# Patient Record
Sex: Female | Born: 1965
Health system: Southern US, Community
[De-identification: ages and names within clinical notes are randomized; demographics above are authoritative.]

## PROBLEM LIST (undated history)

## (undated) DIAGNOSIS — B009 Herpesviral infection, unspecified: Secondary | ICD-10-CM

## (undated) DIAGNOSIS — F4310 Post-traumatic stress disorder, unspecified: Secondary | ICD-10-CM

## (undated) DIAGNOSIS — J45909 Unspecified asthma, uncomplicated: Secondary | ICD-10-CM

## (undated) DIAGNOSIS — D649 Anemia, unspecified: Secondary | ICD-10-CM

## (undated) DIAGNOSIS — T1490XA Injury, unspecified, initial encounter: Secondary | ICD-10-CM

## (undated) DIAGNOSIS — T7840XA Allergy, unspecified, initial encounter: Secondary | ICD-10-CM

## (undated) DIAGNOSIS — M797 Fibromyalgia: Secondary | ICD-10-CM

## (undated) DIAGNOSIS — R87629 Unspecified abnormal cytological findings in specimens from vagina: Secondary | ICD-10-CM

## (undated) DIAGNOSIS — N979 Female infertility, unspecified: Secondary | ICD-10-CM

## (undated) DIAGNOSIS — Z8541 Personal history of malignant neoplasm of cervix uteri: Secondary | ICD-10-CM

## (undated) DIAGNOSIS — B977 Papillomavirus as the cause of diseases classified elsewhere: Secondary | ICD-10-CM

## (undated) HISTORY — PX: OTHER SURGICAL HISTORY: SHX169

## (undated) HISTORY — DX: Fibromyalgia: M79.7

## (undated) HISTORY — DX: Injury, unspecified, initial encounter: T14.90XA

## (undated) HISTORY — DX: Anemia, unspecified: D64.9

## (undated) HISTORY — DX: Unspecified abnormal cytological findings in specimens from vagina: R87.629

## (undated) HISTORY — DX: Allergy, unspecified, initial encounter: T78.40XA

## (undated) HISTORY — DX: Female infertility, unspecified: N97.9

## (undated) HISTORY — DX: Herpesviral infection, unspecified: B00.9

---

## 1987-04-11 HISTORY — PX: CERVIX SURGERY: SHX593

## 1989-04-10 HISTORY — PX: RHINOPLASTY: SUR1284

## 1998-04-10 HISTORY — PX: MANDIBLE RECONSTRUCTION: SHX431

## 1998-06-22 ENCOUNTER — Encounter: Payer: Self-pay | Admitting: Oral & Maxillofacial Surgery

## 1998-06-28 ENCOUNTER — Inpatient Hospital Stay (HOSPITAL_COMMUNITY): Admission: RE | Admit: 1998-06-28 | Discharge: 1998-06-29 | Payer: Self-pay | Admitting: Oral & Maxillofacial Surgery

## 1999-09-07 ENCOUNTER — Inpatient Hospital Stay (HOSPITAL_COMMUNITY): Admission: EM | Admit: 1999-09-07 | Discharge: 1999-09-11 | Payer: Self-pay

## 1999-09-12 ENCOUNTER — Other Ambulatory Visit: Admission: RE | Admit: 1999-09-12 | Discharge: 1999-09-23 | Payer: Self-pay

## 1999-09-20 ENCOUNTER — Emergency Department (HOSPITAL_COMMUNITY): Admission: EM | Admit: 1999-09-20 | Discharge: 1999-09-21 | Payer: Self-pay | Admitting: Emergency Medicine

## 1999-11-25 ENCOUNTER — Encounter: Payer: Self-pay | Admitting: Family Medicine

## 1999-11-25 ENCOUNTER — Encounter: Admission: RE | Admit: 1999-11-25 | Discharge: 1999-11-25 | Payer: Self-pay | Admitting: Family Medicine

## 2000-04-09 ENCOUNTER — Other Ambulatory Visit: Admission: RE | Admit: 2000-04-09 | Discharge: 2000-04-09 | Payer: Self-pay | Admitting: Obstetrics and Gynecology

## 2000-09-11 ENCOUNTER — Encounter: Payer: Self-pay | Admitting: *Deleted

## 2000-09-11 ENCOUNTER — Inpatient Hospital Stay (HOSPITAL_COMMUNITY): Admission: AD | Admit: 2000-09-11 | Discharge: 2000-09-11 | Payer: Self-pay | Admitting: *Deleted

## 2001-04-24 ENCOUNTER — Other Ambulatory Visit: Admission: RE | Admit: 2001-04-24 | Discharge: 2001-04-24 | Payer: Self-pay | Admitting: *Deleted

## 2002-02-15 ENCOUNTER — Encounter: Payer: Self-pay | Admitting: Emergency Medicine

## 2002-02-15 ENCOUNTER — Emergency Department (HOSPITAL_COMMUNITY): Admission: EM | Admit: 2002-02-15 | Discharge: 2002-02-15 | Payer: Self-pay | Admitting: Emergency Medicine

## 2002-05-23 ENCOUNTER — Other Ambulatory Visit: Admission: RE | Admit: 2002-05-23 | Discharge: 2002-05-23 | Payer: Self-pay | Admitting: *Deleted

## 2008-10-05 ENCOUNTER — Other Ambulatory Visit: Admission: RE | Admit: 2008-10-05 | Discharge: 2008-10-05 | Payer: Self-pay | Admitting: Internal Medicine

## 2008-10-07 ENCOUNTER — Encounter: Admission: RE | Admit: 2008-10-07 | Discharge: 2008-11-04 | Payer: Self-pay | Admitting: Internal Medicine

## 2010-12-27 ENCOUNTER — Other Ambulatory Visit (HOSPITAL_COMMUNITY)
Admission: RE | Admit: 2010-12-27 | Discharge: 2010-12-27 | Disposition: A | Discharge status: Home / Self Care | Payer: BC Managed Care – PPO | Source: Ambulatory Visit | Attending: Internal Medicine | Admitting: Internal Medicine

## 2010-12-27 ENCOUNTER — Other Ambulatory Visit: Payer: Self-pay | Admitting: *Deleted

## 2010-12-27 DIAGNOSIS — Z1159 Encounter for screening for other viral diseases: Secondary | ICD-10-CM | POA: Insufficient documentation

## 2010-12-27 DIAGNOSIS — Z01419 Encounter for gynecological examination (general) (routine) without abnormal findings: Secondary | ICD-10-CM | POA: Insufficient documentation

## 2012-04-25 ENCOUNTER — Emergency Department (HOSPITAL_COMMUNITY)
Admission: EM | Admit: 2012-04-25 | Discharge: 2012-04-25 | Disposition: A | Discharge status: Home / Self Care | Payer: BC Managed Care – PPO | Attending: Emergency Medicine | Admitting: Emergency Medicine

## 2012-04-25 ENCOUNTER — Encounter (HOSPITAL_COMMUNITY): Payer: Self-pay | Admitting: *Deleted

## 2012-04-25 ENCOUNTER — Emergency Department (HOSPITAL_COMMUNITY): Payer: BC Managed Care – PPO

## 2012-04-25 DIAGNOSIS — M7530 Calcific tendinitis of unspecified shoulder: Secondary | ICD-10-CM | POA: Insufficient documentation

## 2012-04-25 DIAGNOSIS — Z8659 Personal history of other mental and behavioral disorders: Secondary | ICD-10-CM | POA: Insufficient documentation

## 2012-04-25 DIAGNOSIS — J45909 Unspecified asthma, uncomplicated: Secondary | ICD-10-CM | POA: Insufficient documentation

## 2012-04-25 HISTORY — DX: Unspecified asthma, uncomplicated: J45.909

## 2012-04-25 HISTORY — DX: Post-traumatic stress disorder, unspecified: F43.10

## 2012-04-25 MED ORDER — CYCLOBENZAPRINE HCL 10 MG PO TABS
10.0000 mg | ORAL_TABLET | Freq: Once | ORAL | Status: AC
  Administered 2012-04-25: 10 mg via ORAL
  Filled 2012-04-25: qty 1

## 2012-04-25 MED ORDER — IBUPROFEN 600 MG PO TABS: 600.0000 mg | ORAL_TABLET | Freq: Four times a day (QID) | ORAL | Status: DC | PRN

## 2012-04-25 MED ORDER — CYCLOBENZAPRINE HCL 10 MG PO TABS: 10.0000 mg | ORAL_TABLET | Freq: Two times a day (BID) | ORAL | Status: DC | PRN

## 2012-04-25 MED ORDER — IBUPROFEN 800 MG PO TABS
800.0000 mg | ORAL_TABLET | Freq: Once | ORAL | Status: AC
  Administered 2012-04-25: 800 mg via ORAL
  Filled 2012-04-25: qty 1

## 2012-04-25 NOTE — ED Provider Notes (Signed)
History     CSN: [id number]  Arrival date & time 04/25/12  9147   First MD Initiated Contact with Patient 04/25/12 0454      Chief Complaint  Patient presents with  . Shoulder Pain    (Consider location/radiation/quality/duration/timing/severity/associated sxs/prior treatment) HPI Comments: [name] presents ambulatory for evaluation.  She reports waking yesterday morning with severe right shoulder pain.  She initially attributed the discomfort to sleeping awkwardly, however instead of improving as the day progressed, the pain became more severe.  She cannot recall any specific trauma or awkward movement that may have injured the joint.  She does recount lifting her 50+ pound dog the day before.  She denies fever, joint swelling, acute neck pain, CP, SOB, palpitations, migratory arthropathies, and any hx of being immunocompromised.  Patient is a 47 y.o. female presenting with shoulder pain. The history is provided by the patient. No language interpreter was used.  Shoulder Pain This is a new problem. The current episode started yesterday. The problem occurs constantly. The problem has been gradually worsening. Pertinent negatives include no chest pain, no abdominal pain, no headaches and no shortness of breath. Exacerbated by: using the limb. Relieved by: remaining still. She has tried nothing for the symptoms.    Past Medical History  Diagnosis Date  . Asthma   . PTSD (post-traumatic stress disorder)     Past Surgical History  Procedure Date  . Mandible reconstruction     No family history on file.  History  Substance Use Topics  . Smoking status: Not on file  . Smokeless tobacco: Not on file  . Alcohol Use: No    OB History    Grav Para Term Preterm Abortions TAB SAB Ect Mult Living                  Review of Systems  Respiratory: Negative for shortness of breath.   Cardiovascular: Negative for chest pain.  Gastrointestinal: Negative for abdominal pain.    Neurological: Negative for headaches.  All other systems reviewed and are negative.    Allergies  Sulfa antibiotics  Home Medications  No current outpatient prescriptions on file.  BP 134/83  Pulse 105  Temp 98 F (36.7 C)  Resp 20  SpO2 100%  LMP 03/25/2012  Physical Exam  Nursing note and vitals reviewed. Constitutional: She is oriented to person, place, and time. She appears well-developed and well-nourished. No distress.  HENT:  Head: Normocephalic and atraumatic.  Right Ear: External ear normal.  Left Ear: External ear normal.  Nose: Nose normal.  Mouth/Throat: Oropharynx is clear and moist.  Eyes: Conjunctivae normal are normal. Pupils are equal, round, and reactive to light. Right eye exhibits no discharge. Left eye exhibits no discharge. No scleral icterus.  Neck: Normal range of motion. Neck supple. No JVD present. No tracheal deviation present.  Cardiovascular: Normal rate, regular rhythm and intact distal pulses.  Exam reveals no gallop and no friction rub.   No murmur heard. Pulmonary/Chest: Effort normal and breath sounds normal. No stridor. No respiratory distress. She has no wheezes. She has no rales. She exhibits no tenderness.  Abdominal: Soft. Bowel sounds are normal. There is no tenderness. There is no rebound and no guarding.  Musculoskeletal: She exhibits tenderness. She exhibits no edema.       Right shoulder: She exhibits decreased range of motion, tenderness and pain. She exhibits no bony tenderness, no swelling, no effusion, no crepitus, no laceration and no spasm.  Left shoulder: Normal.       Right elbow: Normal.      Cervical back: Normal.       Note pain with palp diffusely over the anterior shoulder.  No bruising noted.  Note a normal and symmetric contour to the shoulder.  There is no joint fullness or palpable effusion.  Note no widening of the [facility] joint and the humeral head appears appropriately placed.  Pt is holding the arm adducted and  internally rotated with the elbow flexed.  She is able to raise (abduct) the arm to almost 45 degrees forward and laterally before pain limits the further mvmnt.    Neurological: She is alert and oriented to person, place, and time.  Skin: Skin is warm and dry. No rash noted. She is not diaphoretic. No erythema. No pallor.  Psychiatric: She has a normal mood and affect. Her behavior is normal.    ED Course  Procedures (including critical care time)  Labs Reviewed - No data to display No results found.   No diagnosis found.    MDM  Pt presents for evaluation of right shoulder pain.  She appears nontoxic, note stable VS, NAD.  She has anterior shoulder pain without deformity and diminished abduction and external rotation actively and passively (secondary to pain).  Will obtain x-rays of the shoulder and have a sling placed for pt comfort.  1610.  Pt stable, NAD.  There are calcifications lateral to the humeral head that suggests calcific tendonitis as opposed to another process.  Will prescribe a course of ibuprofen and flexeril.  Will refer to Dr. Carola Frost (ortho).       Tobin Chad, MD 04/25/12 [phone]

## 2012-04-25 NOTE — ED Notes (Signed)
Pt states woke up yesterday morning with right shoulder pain; chiropractor worked on shoulder yesterday and made it worse

## 2012-06-02 ENCOUNTER — Encounter (HOSPITAL_COMMUNITY): Payer: Self-pay | Admitting: *Deleted

## 2012-06-02 ENCOUNTER — Inpatient Hospital Stay (HOSPITAL_COMMUNITY): Payer: BC Managed Care – PPO

## 2012-06-02 ENCOUNTER — Inpatient Hospital Stay (HOSPITAL_COMMUNITY)
Admission: AD | Admit: 2012-06-02 | Discharge: 2012-06-02 | Disposition: A | Discharge status: Home / Self Care | Payer: BC Managed Care – PPO | Source: Ambulatory Visit | Attending: Family Medicine | Admitting: Family Medicine

## 2012-06-02 DIAGNOSIS — D251 Intramural leiomyoma of uterus: Secondary | ICD-10-CM | POA: Insufficient documentation

## 2012-06-02 DIAGNOSIS — N949 Unspecified condition associated with female genital organs and menstrual cycle: Secondary | ICD-10-CM

## 2012-06-02 DIAGNOSIS — N938 Other specified abnormal uterine and vaginal bleeding: Secondary | ICD-10-CM

## 2012-06-02 HISTORY — DX: Personal history of malignant neoplasm of cervix uteri: Z85.41

## 2012-06-02 HISTORY — DX: Papillomavirus as the cause of diseases classified elsewhere: B97.7

## 2012-06-02 LAB — COMPREHENSIVE METABOLIC PANEL
ALT: 10 U/L (ref 0–35)
AST: 15 U/L (ref 0–37)
CO2: 26 mEq/L (ref 19–32)
Calcium: 8.8 mg/dL (ref 8.4–10.5)
Chloride: 104 mEq/L (ref 96–112)
Creatinine, Ser: 0.72 mg/dL (ref 0.50–1.10)
GFR calc Af Amer: >90 mL/min (ref >90)
GFR calc non Af Amer: >90 mL/min (ref >90)
Glucose, Bld: 108 mg/dL — ABNORMAL HIGH (ref 70–99)
Sodium: 138 mEq/L (ref 135–145)
Total Bilirubin: 0.2 mg/dL — ABNORMAL LOW (ref 0.3–1.2)

## 2012-06-02 LAB — CBC
Hemoglobin: 12.7 g/dL (ref 12.0–15.0)
MCH: 30 pg (ref 26.0–34.0)
MCV: 90.3 fL (ref 78.0–100.0)
RBC: 4.24 MIL/uL (ref 3.87–5.11)
WBC: 9.5 10*3/uL (ref 4.0–10.5)

## 2012-06-02 MED ORDER — LACTATED RINGERS IV BOLUS (SEPSIS): 1000.0000 mL | Freq: Once | INTRAVENOUS | Status: DC

## 2012-06-02 MED ORDER — OXYCODONE-ACETAMINOPHEN 5-325 MG PO TABS: 1.0000 | ORAL_TABLET | ORAL | Status: DC | PRN

## 2012-06-02 MED ORDER — OXYCODONE-ACETAMINOPHEN 5-325 MG PO TABS: 1.0000 | ORAL_TABLET | Freq: Four times a day (QID) | ORAL | Status: DC | PRN

## 2012-06-02 MED ORDER — OXYCODONE-ACETAMINOPHEN 5-325 MG PO TABS
1.0000 | ORAL_TABLET | Freq: Once | ORAL | Status: DC
  Filled 2012-06-02: qty 1

## 2012-06-02 MED ORDER — ACETAMINOPHEN-CODEINE #3 300-30 MG PO TABS: 1.0000 | ORAL_TABLET | ORAL | Status: DC | PRN

## 2012-06-02 MED ORDER — CYCLOBENZAPRINE HCL 10 MG PO TABS
10.0000 mg | ORAL_TABLET | Freq: Once | ORAL | Status: AC
  Administered 2012-06-02: 10 mg via ORAL
  Filled 2012-06-02: qty 1

## 2012-06-02 NOTE — MAU Provider Note (Signed)
Care assumed from [facility], [place] at 0800, pt. In [place].   Clinical Data: Pelvic pain/bleeding. History of cervical cancer  and human papilloma virus infection.  TRANSABDOMINAL AND TRANSVAGINAL ULTRASOUND OF PELVIS  Technique: Both transabdominal and transvaginal ultrasound  examinations of the pelvis were performed. Transabdominal technique  was performed for global imaging of the pelvis including uterus,  ovaries, adnexal regions, and pelvic cul-de-sac.  It was necessary to proceed with endovaginal exam following the  transabdominal exam to visualize the uterus, ovaries, and adnexa .  Comparison: None  Findings:  Uterus: 11.3 x 6.7 x 8.3 cm. Heterogeneous echotexture throughout.  Numerous uterine masses. A posterior uterine body myometrial  lesion measures 4.4 x 4.1 x 4.4 cm and is heterogeneous in  shadowing.  An anterior intramural body/lower uterine segment lesion measures  3.6 x 1.5 x 2.4 cm.  An anterior right-sided uterine body intramural lesion measures 3.9  x 2.9 x 3.5 cm.  Endometrium: Poorly evaluated secondary to the extent of uterine  masses. Grossly normal at 8 mm on image 51.  Right ovary: 2.6 x 1.6 x 1.4 cm. Normal in morphology.  Left ovary: 2.6 x 1.7 x 1.7 cm. Suspect a corpus luteal cyst  within, including on image 58.  Other findings: No free fluid  IMPRESSION:  1. Multiple uterine masses, most consistent with fibroids.  2. The extent of uterine distortion secondary to fibroids makes  evaluation of the endometrium difficult. Grossly within normal  limits. If symptoms warrant, non emergent MRI may be informative.  3. Probable left ovarian corpus luteal cyst.  Original Report Authenticated By: Jeronimo Greaves, M.D.  Results for orders placed during the hospital encounter of 06/02/12 (from the past 24 hour(s))  CBC     Status: None   Collection Time    06/02/12  6:27 AM      Result Value Range   WBC 9.5  4.0 - 10.5 K/uL   RBC 4.24  3.87 - 5.11 MIL/uL   Hemoglobin 12.7  12.0 - 15.0 g/dL   HCT 45.4  09.8 - 11.9 %   MCV 90.3  78.0 - 100.0 fL   MCH 30.0  26.0 - 34.0 pg   MCHC 33.2  30.0 - 36.0 g/dL   RDW 14.7  82.9 - 56.2 %   Platelets 233  150 - 400 K/uL  COMPREHENSIVE METABOLIC PANEL     Status: Abnormal   Collection Time    06/02/12  6:27 AM      Result Value Range   Sodium 138  135 - 145 mEq/L   Potassium 4.3  3.5 - 5.1 mEq/L   Chloride 104  96 - 112 mEq/L   CO2 26  19 - 32 mEq/L   Glucose, Bld 108 (*) 70 - 99 mg/dL   BUN 10  6 - 23 mg/dL   Creatinine, Ser 1.30  0.50 - 1.10 mg/dL   Calcium 8.8  8.4 - 86.5 mg/dL   Total Protein 6.4  6.0 - 8.3 g/dL   Albumin 3.6  3.5 - 5.2 g/dL   AST 15  0 - 37 U/L   ALT 10  0 - 35 U/L   Alkaline Phosphatase 53  39 - 117 U/L   Total Bilirubin 0.2 (*) 0.3 - 1.2 mg/dL   GFR calc non Af Amer >90  >90 mL/min   GFR calc Af Amer >90  >90 mL/min    Report reviewed with pt. Questions answered and explained fibroids and reassured re: small CLC.  Pt tearful and anxious, financial problems and no care provider.  Percocet 2 tabs po given.   Assessment 1. Fibroids, intramural   Pelvic pain and AUB  Plan Discharge home. See AVS for pt ed.    Medication List    TAKE these medications       acetaminophen-codeine 300-30 MG per tablet  Commonly known as:  TYLENOL #3  Take 1 tablet by mouth every 4 (four) hours as needed for pain.     albuterol 108 (90 BASE) MCG/ACT inhaler  Commonly known as:  PROVENTIL HFA;VENTOLIN HFA  Inhale 2 puffs into the lungs every 6 (six) hours as needed. For shortness of breath     cyclobenzaprine 10 MG tablet  Commonly known as:  FLEXERIL  Take 1 tablet (10 mg total) by mouth 2 (two) times daily as needed for muscle spasms.     EPINEPHrine 0.3 mg/0.3 mL Devi  Commonly known as:  EPI-PEN  Inject 0.3 mg into the muscle as needed. For allergic reactions     ibuprofen 600 MG tablet  Commonly known as:  ADVIL,MOTRIN  Take 1 tablet (600 mg total) by mouth every 6 (six)  hours as needed for pain.       Follow-up Information   Schedule an appointment as soon as possible for a visit with [facility]. (Someone form [facility] will call you with an appointment)    Contact information:   [address] [place] [address] [phone]

## 2012-06-02 NOTE — MAU Provider Note (Signed)
  History     CSN: [id number]  Arrival date and time: 06/02/12 0442   None     Chief Complaint  Patient presents with  . Menorrhagia   HPI Pt is here with report of abdominal pain that started Friday.  Reports taking pamprin with some relief.  Also reports vaginal bleeding that started on Friday.  Bleeding heavy now. Reports not having on a pad at this.  Using a tampon and paper towel.  Reports history of this happening before.  Patient's last menstrual period was 05/17/2012.  Does not report a cycle in January or December.  No current birth control.     Past Medical History  Diagnosis Date  . Asthma   . PTSD (post-traumatic stress disorder)   . Hx of cervical cancer   . HPV (human papilloma virus) infection     Past Surgical History  Procedure Laterality Date  . Mandible reconstruction      History reviewed. No pertinent family history.  History  Substance Use Topics  . Smoking status: Not on file  . Smokeless tobacco: Not on file  . Alcohol Use: No    Allergies:  Allergies  Allergen Reactions  . Food     Multiple foods trigger patients asthma ,some lead to anaphylactic shock   . Sulfa Antibiotics Rash    Prescriptions prior to admission  Medication Sig Dispense Refill  . ibuprofen (ADVIL,MOTRIN) 600 MG tablet Take 1 tablet (600 mg total) by mouth every 6 (six) hours as needed for pain.  28 tablet  0  . albuterol (PROVENTIL HFA;VENTOLIN HFA) 108 (90 BASE) MCG/ACT inhaler Inhale 2 puffs into the lungs every 6 (six) hours as needed. For shortness of breath      . cyclobenzaprine (FLEXERIL) 10 MG tablet Take 1 tablet (10 mg total) by mouth 2 (two) times daily as needed for muscle spasms.  20 tablet  0  . EPINEPHrine (EPI-PEN) 0.3 mg/0.3 mL DEVI Inject 0.3 mg into the muscle as needed. For allergic reactions        Review of Systems  Constitutional: Negative for malaise/fatigue.  Respiratory: Negative for shortness of breath.   Gastrointestinal: Positive for  abdominal pain (cramping).  Genitourinary:       Vaginal bleeding  Neurological: Negative for dizziness and headaches.   Physical Exam   Blood pressure 118/70, pulse 79, temperature 98.4 F (36.9 C), temperature source Oral, resp. rate 20, height 5\' 6"  (1.676 m), weight 77.168 kg (170 lb 2 oz), last menstrual period 05/17/2012.  Physical Exam  Constitutional: She is oriented to person, place, and time. She appears well-developed and well-nourished. No distress.  HENT:  Head: Normocephalic.  Neck: Normal range of motion. Neck supple.  Cardiovascular: Normal rate, regular rhythm and normal heart sounds.   Respiratory: Effort normal and breath sounds normal.  GI: Soft. There is no tenderness.  Genitourinary: There is bleeding (negative clots, moderate) around the vagina.  Musculoskeletal: Normal range of motion. She exhibits no edema.  Neurological: She is alert and oriented to person, place, and time. She has normal reflexes.  Skin: Skin is warm and dry.    MAU Course  Procedures  0800 Deirdre Poe assumes care of patient.   Assessment and Plan    [facility] 06/02/2012, 5:46 AM

## 2012-06-02 NOTE — MAU Note (Signed)
PT SAYS SHE HAS ABD  PAIN  - STARTED  Friday- TOOK TODAY 1 PAMPRIN.- SOME RELIEF.  VAG BLEEDING STARTED Friday NIGHT - HEAVY . NO PAD ON NOW- BUT  TAMPON AND PAPER TOWELS.    THIS IS NOT FIRST  TIME HAPPENED.   LMP WAS 2-7,  THINKS NO CYCLE IN JAN AND DEC.  NO BIRTH CONTROL .

## 2012-06-02 NOTE — ED Notes (Signed)
Pt refused to take percocet stated she wanted tylenol #3 . Offered to have order changed pt did not want to wait. Notified D.Poe, CNM. Discharge instructions given pt went home with RX for tylenol #3.

## 2012-06-03 NOTE — MAU Provider Note (Signed)
Attestation of Attending Supervision of Advanced Practitioner (CNM/NP): Evaluation and management procedures were performed by the Advanced Practitioner under my supervision and collaboration.  I have reviewed the Advanced Practitioner's note and chart, and I agree with the management and plan.  Tyronn Golda 06/03/2012 8:46 AM

## 2012-06-04 ENCOUNTER — Encounter: Payer: Self-pay | Admitting: Obstetrics & Gynecology

## 2012-06-05 NOTE — MAU Provider Note (Signed)
Chart reviewed and agree with management and plan.  

## 2012-07-03 ENCOUNTER — Encounter: Payer: BC Managed Care – PPO | Admitting: Obstetrics & Gynecology

## 2012-07-26 ENCOUNTER — Encounter: Payer: BC Managed Care – PPO | Admitting: Obstetrics & Gynecology

## 2012-08-05 ENCOUNTER — Encounter: Payer: BC Managed Care – PPO | Admitting: Obstetrics & Gynecology

## 2013-05-13 ENCOUNTER — Inpatient Hospital Stay (HOSPITAL_COMMUNITY)
Admission: AD | Admit: 2013-05-13 | Discharge: 2013-05-14 | Disposition: A | Discharge status: Home / Self Care | Payer: Self-pay | Source: Ambulatory Visit | Attending: Obstetrics & Gynecology | Admitting: Obstetrics & Gynecology

## 2013-05-13 ENCOUNTER — Encounter (HOSPITAL_COMMUNITY): Payer: Self-pay | Admitting: *Deleted

## 2013-05-13 ENCOUNTER — Inpatient Hospital Stay (HOSPITAL_COMMUNITY): Payer: Self-pay

## 2013-05-13 DIAGNOSIS — R109 Unspecified abdominal pain: Secondary | ICD-10-CM | POA: Insufficient documentation

## 2013-05-13 DIAGNOSIS — N949 Unspecified condition associated with female genital organs and menstrual cycle: Secondary | ICD-10-CM | POA: Insufficient documentation

## 2013-05-13 DIAGNOSIS — D259 Leiomyoma of uterus, unspecified: Secondary | ICD-10-CM | POA: Insufficient documentation

## 2013-05-13 DIAGNOSIS — N938 Other specified abnormal uterine and vaginal bleeding: Secondary | ICD-10-CM | POA: Insufficient documentation

## 2013-05-13 DIAGNOSIS — N926 Irregular menstruation, unspecified: Secondary | ICD-10-CM

## 2013-05-13 DIAGNOSIS — N939 Abnormal uterine and vaginal bleeding, unspecified: Secondary | ICD-10-CM

## 2013-05-13 DIAGNOSIS — N83209 Unspecified ovarian cyst, unspecified side: Secondary | ICD-10-CM | POA: Insufficient documentation

## 2013-05-13 DIAGNOSIS — D219 Benign neoplasm of connective and other soft tissue, unspecified: Secondary | ICD-10-CM

## 2013-05-13 LAB — CBC
HEMATOCRIT: 35.1 % — AB (ref 36.0–46.0)
Hemoglobin: 11.7 g/dL — ABNORMAL LOW (ref 12.0–15.0)
MCH: 29.8 pg (ref 26.0–34.0)
MCHC: 33.3 g/dL (ref 30.0–36.0)
MCV: 89.5 fL (ref 78.0–100.0)
Platelets: 261 10*3/uL (ref 150–400)
RBC: 3.92 MIL/uL (ref 3.87–5.11)
RDW: 12.5 % (ref 11.5–15.5)
WBC: 7.6 10*3/uL (ref 4.0–10.5)

## 2013-05-13 MED ORDER — CYCLOBENZAPRINE HCL 10 MG PO TABS
10.0000 mg | ORAL_TABLET | Freq: Once | ORAL | Status: AC
  Administered 2013-05-14: 10 mg via ORAL
  Filled 2013-05-13: qty 1

## 2013-05-13 MED ORDER — OXYCODONE-ACETAMINOPHEN 5-325 MG PO TABS
1.0000 | ORAL_TABLET | Freq: Once | ORAL | Status: AC
  Administered 2013-05-14: 1 via ORAL
  Filled 2013-05-13: qty 1

## 2013-05-13 MED ORDER — ACETAMINOPHEN-CODEINE #3 300-30 MG PO TABS
1.0000 | ORAL_TABLET | Freq: Once | ORAL | Status: AC
  Administered 2013-05-13: 1 via ORAL
  Filled 2013-05-13: qty 1

## 2013-05-13 NOTE — MAU Note (Signed)
Pt states she was seen here before for fibroid tumors. Pt states bleeding through wash clothes and dish towels

## 2013-05-13 NOTE — MAU Provider Note (Signed)
History     CSN: [id number]  Arrival date and time: 05/13/13 2031   First Provider Initiated Contact with Patient 05/13/13 2059      Chief Complaint  Patient presents with  . Vaginal Bleeding   HPI [name] is a 48 y.o. G1P0010 who presents to MAU today with complaint of heavy vaginal bleeding and lower abdominal pain. The patient states a long history of abnormal uterine bleeding. Patient was seen in MAU 05/2012 and US showed multiple uterine fibroids. Visits in [place] show that patient cancelled and no-showed for follow-up in the clinic. The patient states that she tried numerous times to get a follow-up appointment and couldn't. Today she states 2 days of bleeding. She is soaking through pads and using washcloths or clothing instead of pads. She endorses suprapubic pain that she rates at 8/10 now. She originally denied using any pain medication, but later states that she last took ibuprofen this afternoon. She also states that she feels weak, dizzy and tired.   OB History   Grav Para Term Preterm Abortions TAB SAB Ect Mult Living   1    1 1           Past Medical History  Diagnosis Date  . Asthma   . PTSD (post-traumatic stress disorder)   . Hx of cervical cancer   . HPV (human papilloma virus) infection     Past Surgical History  Procedure Laterality Date  . Mandible reconstruction      History reviewed. No pertinent family history.  History  Substance Use Topics  . Smoking status: Not on file  . Smokeless tobacco: Not on file  . Alcohol Use: No    Allergies:  Allergies  Allergen Reactions  . Food Anaphylaxis    Multiple foods trigger patients asthma ,some lead to anaphylactic shock   . Sulfa Antibiotics Rash    No prescriptions prior to admission    Review of Systems  Constitutional: Positive for malaise/fatigue. Negative for fever.  Gastrointestinal: Positive for abdominal pain. Negative for nausea and vomiting.  Genitourinary:       + vaginal  bleeding  Neurological: Positive for dizziness and weakness. Negative for loss of consciousness.   Physical Exam   Blood pressure 133/91, pulse 80, temperature 98.2 F (36.8 C), temperature source Oral, resp. rate 16, last menstrual period 03/12/2013.  Physical Exam  Constitutional: She is oriented to person, place, and time. She appears well-developed and well-nourished. No distress.  HENT:  Head: Normocephalic and atraumatic.  Cardiovascular: Normal rate.   Respiratory: Effort normal.  GI: Soft. She exhibits no distension and no mass. There is tenderness (lower abdominal tenderness to palpation). There is no rebound and no guarding.  Genitourinary: Uterus is enlarged (slightly, exam limited by patient's body habitus) and tender (mild). Cervix exhibits no motion tenderness, no discharge and no friability. Right adnexum displays no mass and no tenderness. Left adnexum displays no mass and no tenderness. There is bleeding (small amount of blood noted in the vagina) around the vagina. No vaginal discharge found.  Neurological: She is alert and oriented to person, place, and time.  Skin: Skin is warm and dry. No erythema.  Psychiatric: Her mood appears anxious. Her affect is blunt. Her speech is rapid and/or pressured.   Results for orders placed during the hospital encounter of 05/13/13 (from the past 24 hour(s))  CBC     Status: Abnormal   Collection Time    05/13/13 10:30 PM  Result Value Range   WBC 7.6  4.0 - 10.5 K/uL   RBC 3.92  3.87 - 5.11 MIL/uL   Hemoglobin 11.7 (*) 12.0 - 15.0 g/dL   HCT 35.1 (*) 36.0 - 46.0 %   MCV 89.5  78.0 - 100.0 fL   MCH 29.8  26.0 - 34.0 pg   MCHC 33.3  30.0 - 36.0 g/dL   RDW 12.5  11.5 - 15.5 %   Platelets 261  150 - 400 K/uL   US Transvaginal Non-ob  05/13/2013   CLINICAL DATA:  Vaginal bleeding.  History of fibroids  EXAM: TRANSABDOMINAL AND TRANSVAGINAL ULTRASOUND OF PELVIS  TECHNIQUE: Both transabdominal and transvaginal ultrasound  examinations of the pelvis were performed. Transabdominal technique was performed for global imaging of the pelvis including uterus, ovaries, adnexal regions, and pelvic cul-de-sac. It was necessary to proceed with endovaginal exam following the transabdominal exam to visualize the endometrium and adnexae.  COMPARISON:  06/02/2012  FINDINGS: Uterus  Measurements: 12 x 8 x 11 cm. There are at least 4 myometrial masses. The largest is intramural to a sub serosal in the left uterine body, 5 x 3 x 3 cm. The second largest is in the right cornual region, also involving the serosal surface, 3 x 3 x 3 cm. There is a 2 cm mass in the uterine fundus. A 3 x 1 x 2 cm mass in the upper left uterine body is intramural. These masses have heterogeneous echotexture consistent with fibroids.  Endometrium  Distortion by uterine fibroids, limiting assessment of the endometrial thickness. Sensitivity is decreased for detecting of focal endometrial abnormality.  Right ovary  Measurements: The ovarian parenchyma is not enlarged. There is a simple cyst, likely follicular, within the ovary measuring 3.5 x 2.6 x 2.4 cm.  Left ovary  Not seen. Exam was terminated prematurely due to patient discomfort.  Other findings  No free fluid.  IMPRESSION: 1. Fibroid uterus, as above. 2. Left ovary not seen. 3. 3.5 cm simple cyst in the right ovary, likely follicular.   Electronically Signed   By: Jorje Guild M.D.   On: 05/13/2013 23:18   US Pelvis Complete  05/13/2013   CLINICAL DATA:  Vaginal bleeding.  History of fibroids  EXAM: TRANSABDOMINAL AND TRANSVAGINAL ULTRASOUND OF PELVIS  TECHNIQUE: Both transabdominal and transvaginal ultrasound examinations of the pelvis were performed. Transabdominal technique was performed for global imaging of the pelvis including uterus, ovaries, adnexal regions, and pelvic cul-de-sac. It was necessary to proceed with endovaginal exam following the transabdominal exam to visualize the endometrium and adnexae.   COMPARISON:  06/02/2012  FINDINGS: Uterus  Measurements: 12 x 8 x 11 cm. There are at least 4 myometrial masses. The largest is intramural to a sub serosal in the left uterine body, 5 x 3 x 3 cm. The second largest is in the right cornual region, also involving the serosal surface, 3 x 3 x 3 cm. There is a 2 cm mass in the uterine fundus. A 3 x 1 x 2 cm mass in the upper left uterine body is intramural. These masses have heterogeneous echotexture consistent with fibroids.  Endometrium  Distortion by uterine fibroids, limiting assessment of the endometrial thickness. Sensitivity is decreased for detecting of focal endometrial abnormality.  Right ovary  Measurements: The ovarian parenchyma is not enlarged. There is a simple cyst, likely follicular, within the ovary measuring 3.5 x 2.6 x 2.4 cm.  Left ovary  Not seen. Exam was terminated prematurely due to patient discomfort.  Other findings  No free fluid.  IMPRESSION: 1. Fibroid uterus, as above. 2. Left ovary not seen. 3. 3.5 cm simple cyst in the right ovary, likely follicular.   Electronically Signed   By: Jorje Guild M.D.   On: 05/13/2013 23:18    MAU Course  Procedures None  MDM CBC and [place] today Patient is hemodynamically stable Due to patient's psych issues and extensive amount of time was spent on patient education Patient refused Rx for Megace today. States that she "doesn't want to take hormones."  Discussed options for management that would be addressed at the follow-up visit at the patient's request.  She is not interested in Depo provera, Mirena IUD or hysterectomy. She was very interested in ablation.  Patient insisted that Flexeril was helpful for her pain previously. She states that her lower back felt strained.  Assessment and Plan  A: Multiple uterine fibroids Simple ovarian cyst  P: Discharge home Rx for Flexeril and Percocet given to patient Bleeding precautions discussed Patient scheduled to follow-up in [facility] with  MD on 05/15/13 @ 1:00 PM Patient may return to MAU as needed or if her condition were to change or worsen  Farris Has, PA-C  05/14/2013, 4:07 AM

## 2013-05-14 ENCOUNTER — Encounter: Payer: Self-pay | Admitting: Obstetrics & Gynecology

## 2013-05-14 MED ORDER — CYCLOBENZAPRINE HCL 10 MG PO TABS: 10.0000 mg | ORAL_TABLET | Freq: Once | ORAL | Status: DC

## 2013-05-14 MED ORDER — OXYCODONE-ACETAMINOPHEN 5-325 MG PO TABS: 1.0000 | ORAL_TABLET | ORAL | Status: DC | PRN

## 2013-05-14 NOTE — Progress Notes (Signed)
Pt has had abnormal bleeding for 1 year- seen most recently in MAU yesterday.  Tried to discuss the possibility of Dr. Roselie Awkward doing endometrial biopsy today. During my explanation, pt interrupted many times and was resistant to anything I was trying to tell her. Pt does not want to have the test today because she is by herself.

## 2013-05-14 NOTE — Discharge Instructions (Signed)
Abnormal Uterine Bleeding Abnormal uterine bleeding can affect women at various stages in life, including teenagers, women in their reproductive years, pregnant women, and women who have reached menopause. Several kinds of uterine bleeding are considered abnormal, including:  Bleeding or spotting between periods.   Bleeding after sexual intercourse.   Bleeding that is heavier or more than normal.   Periods that last longer than usual.  Bleeding after menopause.  Many cases of abnormal uterine bleeding are minor and simple to treat, while others are more serious. Any type of abnormal bleeding should be evaluated by your health care provider. Treatment will depend on the cause of the bleeding. HOME CARE INSTRUCTIONS Monitor your condition for any changes. The following actions may help to alleviate any discomfort you are experiencing:  Avoid the use of tampons and douches as directed by your health care provider.  Change your pads frequently. You should get regular pelvic exams and Pap tests. Keep all follow-up appointments for diagnostic tests as directed by your health care provider.  SEEK MEDICAL CARE IF:   Your bleeding lasts more than 1 week.   You feel dizzy at times.  SEEK IMMEDIATE MEDICAL CARE IF:   You pass out.   You are changing pads every 15 to 30 minutes.   You have abdominal pain.  You have a fever.   You become sweaty or weak.   You are passing large blood clots from the vagina.   You start to feel nauseous and vomit. MAKE SURE YOU:   Understand these instructions.  Will watch your condition.  Will get help right away if you are not doing well or get worse. Document Released: 03/27/2005 Document Revised: 11/27/2012 Document Reviewed: 10/24/2012 [facility] Patient Information 2014 [place], [place].  Endometrial Ablation Endometrial ablation removes the lining of the uterus (endometrium). It is usually a same-day, outpatient treatment. Ablation  helps avoid major surgery, such as surgery to remove the cervix and uterus (hysterectomy). After endometrial ablation, you will have little or no menstrual bleeding and may not be able to have children. However, if you are premenopausal, you will need to use a reliable method of birth control following the procedure because of the small chance that pregnancy can occur. There are different reasons to have this procedure, which include:  Heavy periods.  Bleeding that is causing anemia.  Irregular bleeding.  Bleeding fibroids on the lining inside the uterus if they are smaller than 3 centimeters. This procedure should not be done if:  You want children in the future.  You have severe cramps with your menstrual period.  You have precancerous or cancerous cells in your uterus.  You were recently pregnant.  You have gone through menopause.  You have had major surgery on the uterus, such as a cesarean delivery. LET [facility] CARE PROVIDER KNOW ABOUT:  Any allergies you have.  All medicines you are taking, including vitamins, herbs, eye drops, creams, and over-the-counter medicines.  Previous problems you or members of your family have had with the use of anesthetics.  Any blood disorders you have.  Previous surgeries you have had.  Medical conditions you have. RISKS AND COMPLICATIONS  Generally, this is a safe procedure. However, as with any procedure, complications can occur. Possible complications include:  Perforation of the uterus.  Bleeding.  Infection of the uterus, bladder, or vagina.  Injury to surrounding organs.  An air bubble to the lung (air embolus).  Pregnancy following the procedure.  Failure of the procedure to help  the problem, requiring hysterectomy.  Decreased ability to diagnose cancer in the lining of the uterus. BEFORE THE PROCEDURE  The lining of the uterus must be tested to make sure there is no pre-cancerous or cancer cells present.  An  ultrasound may be performed to look at the size of the uterus and to check for abnormalities.  Medicines may be given to thin the lining of the uterus. PROCEDURE  During the procedure, your health care provider will use a tool called a resectoscope to help see inside your uterus. There are different ways to remove the lining of your uterus.   Radiofrequency  This method uses a radiofrequency-alternating electric current to remove the lining of the uterus.  Cryotherapy This method uses extreme cold to freeze the lining of the uterus.  Heated-Free Liquid  This method uses heated salt (saline) solution to remove the lining of the uterus.  Microwave This method uses high-energy microwaves to heat up the lining of the uterus to remove it.  Thermal balloon  This method involves inserting a catheter with a balloon tip into the uterus. The balloon tip is filled with heated fluid to remove the lining of the uterus. AFTER THE PROCEDURE  After your procedure, do not have sexual intercourse or insert anything into your vagina until permitted by your health care provider. After the procedure, you may experience:  Cramps.  Vaginal discharge.  Frequent urination. Document Released: 02/04/2004 Document Revised: 11/27/2012 Document Reviewed: 08/28/2012 [facility] Patient Information 2014 [place].

## 2013-05-14 NOTE — MAU Provider Note (Signed)
Attestation of Attending Supervision of Advanced Practitioner (PA/CNM/NP): Evaluation and management procedures were performed by the Advanced Practitioner under my supervision and collaboration.  I have reviewed the Advanced Practitioner's note and chart, and I agree with the management and plan.  Chairty Toman, MD, FACOG Attending Obstetrician & Gynecologist Faculty Practice, [facility]  

## 2013-05-16 ENCOUNTER — Encounter: Payer: BC Managed Care – PPO | Admitting: Obstetrics & Gynecology

## 2013-05-17 NOTE — Progress Notes (Signed)
This encounter was created in error - please disregard.

## 2013-05-21 ENCOUNTER — Inpatient Hospital Stay (HOSPITAL_COMMUNITY)
Admission: AD | Admit: 2013-05-21 | Discharge: 2013-05-21 | Disposition: A | Discharge status: Home / Self Care | Payer: Self-pay | Source: Ambulatory Visit | Attending: Obstetrics & Gynecology | Admitting: Obstetrics & Gynecology

## 2013-05-21 ENCOUNTER — Encounter: Payer: Self-pay | Admitting: Obstetrics and Gynecology

## 2013-05-21 ENCOUNTER — Encounter (HOSPITAL_COMMUNITY): Payer: Self-pay

## 2013-05-21 DIAGNOSIS — T148XXA Other injury of unspecified body region, initial encounter: Secondary | ICD-10-CM

## 2013-05-21 DIAGNOSIS — M549 Dorsalgia, unspecified: Secondary | ICD-10-CM | POA: Insufficient documentation

## 2013-05-21 DIAGNOSIS — J45909 Unspecified asthma, uncomplicated: Secondary | ICD-10-CM | POA: Insufficient documentation

## 2013-05-21 DIAGNOSIS — M545 Low back pain, unspecified: Secondary | ICD-10-CM

## 2013-05-21 DIAGNOSIS — O9989 Other specified diseases and conditions complicating pregnancy, childbirth and the puerperium: Secondary | ICD-10-CM

## 2013-05-21 DIAGNOSIS — O99891 Other specified diseases and conditions complicating pregnancy: Secondary | ICD-10-CM

## 2013-05-21 DIAGNOSIS — F17200 Nicotine dependence, unspecified, uncomplicated: Secondary | ICD-10-CM | POA: Insufficient documentation

## 2013-05-21 LAB — URINALYSIS, ROUTINE W REFLEX MICROSCOPIC
BILIRUBIN URINE: NEGATIVE
Glucose, UA: NEGATIVE mg/dL
HGB URINE DIPSTICK: NEGATIVE
Ketones, ur: NEGATIVE mg/dL
Leukocytes, UA: NEGATIVE
Nitrite: NEGATIVE
PH: 6 (ref 5.0–8.0)
Protein, ur: NEGATIVE mg/dL
Urobilinogen, UA: 0.2 mg/dL (ref 0.0–1.0)

## 2013-05-21 LAB — POCT PREGNANCY, URINE: Preg Test, Ur: NEGATIVE

## 2013-05-21 MED ORDER — KETOROLAC TROMETHAMINE 60 MG/2ML IM SOLN
60.0000 mg | Freq: Once | INTRAMUSCULAR | Status: AC
  Administered 2013-05-21: 60 mg via INTRAMUSCULAR
  Filled 2013-05-21: qty 2

## 2013-05-21 NOTE — Discharge Instructions (Signed)
Back Exercises Back exercises help treat and prevent back injuries. The goal of back exercises is to increase the strength of your abdominal and back muscles and the flexibility of your back. These exercises should be started when you no longer have back pain. Back exercises include:  Pelvic Tilt. Lie on your back with your knees bent. Tilt your pelvis until the lower part of your back is against the floor. Hold this position 5 to 10 sec and repeat 5 to 10 times.  Knee to Chest. Pull first 1 knee up against your chest and hold for 20 to 30 seconds, repeat this with the other knee, and then both knees. This may be done with the other leg straight or bent, whichever feels better.  Sit-Ups or Curl-Ups. Bend your knees 90 degrees. Start with tilting your pelvis, and do a partial, slow sit-up, lifting your trunk only 30 to 45 degrees off the floor. Take at least 2 to 3 seconds for each sit-up. Do not do sit-ups with your knees out straight. If partial sit-ups are difficult, simply do the above but with only tightening your abdominal muscles and holding it as directed.  Hip-Lift. Lie on your back with your knees flexed 90 degrees. Push down with your feet and shoulders as you raise your hips a couple inches off the floor; hold for 10 seconds, repeat 5 to 10 times.  Back arches. Lie on your stomach, propping yourself up on bent elbows. Slowly press on your hands, causing an arch in your low back. Repeat 3 to 5 times. Any initial stiffness and discomfort should lessen with repetition over time.  Shoulder-Lifts. Lie face down with arms beside your body. Keep hips and torso pressed to floor as you slowly lift your head and shoulders off the floor. Do not overdo your exercises, especially in the beginning. Exercises may cause you some mild back discomfort which lasts for a few minutes; however, if the pain is more severe, or lasts for more than 15 minutes, do not continue exercises until you see your caregiver.  Improvement with exercise therapy for back problems is slow.  See your caregivers for assistance with developing a proper back exercise program. Document Released: 05/04/2004 Document Revised: 06/19/2011 Document Reviewed: 01/26/2011 [employer] Patient Information 2014 [employer].  

## 2013-05-21 NOTE — MAU Note (Signed)
Pt reports "major severe tension" all over my body for a couple of days. Took Flexeril at 0200, states she also believes this is due to an allergic reaction to something she ate last pm.

## 2013-05-21 NOTE — MAU Note (Signed)
Required assistance to her car; pt is very needy;

## 2013-05-21 NOTE — MAU Provider Note (Signed)
History     CSN: [id number]  Arrival date and time: 05/21/13 0608   First Provider Initiated Contact with Patient 05/21/13 [phone]      Chief Complaint  Patient presents with  . Back Pain   Back Pain    Brandy Stone is a 48 y.o. G1P0010 who presents today with backache. She was seen on 05/13/13 for heavy vaginal bleeding. She has an appointment today at 2:00 with the clinic. She states that the bleeding has stopped, but now her back hurts. She states that she took flexeril and percocet, but it has not helped. She denies any fever or urinary symptoms. She states the pain starts at the middle of her back and goes down to her sacrum.   Past Medical History  Diagnosis Date  . Asthma   . PTSD (post-traumatic stress disorder)   . Hx of cervical cancer   . HPV (human papilloma virus) infection     Past Surgical History  Procedure Laterality Date  . Mandible reconstruction      Family History  Problem Relation Age of Onset  . Endometriosis Mother   . Hypertension Mother   . Hyperlipidemia Mother   . Cancer Father     lung  . Hyperlipidemia Father     History  Substance Use Topics  . Smoking status: Current Some Day Smoker    Types: Cigarettes  . Smokeless tobacco: Not on file  . Alcohol Use: Yes     Comment: social    Allergies:  Allergies  Allergen Reactions  . Food Anaphylaxis    Multiple foods trigger patients asthma ,some lead to anaphylactic shock   . Sulfa Antibiotics Rash    Prescriptions prior to admission  Medication Sig Dispense Refill  . albuterol (PROVENTIL HFA;VENTOLIN HFA) 108 (90 BASE) MCG/ACT inhaler Inhale 2 puffs into the lungs every 6 (six) hours as needed. For shortness of breath      . cyclobenzaprine (FLEXERIL) 10 MG tablet Take 1 tablet (10 mg total) by mouth once.  30 tablet  0  . ibuprofen (ADVIL,MOTRIN) 600 MG tablet Take 1 tablet (600 mg total) by mouth every 6 (six) hours as needed for pain.  28 tablet  0  . oxyCODONE-acetaminophen  (PERCOCET/ROXICET) 5-325 MG per tablet Take 1-2 tablets by mouth every 4 (four) hours as needed for severe pain.  15 tablet  0  . phenol (CHLORASEPTIC) 1.4 % LIQD Use as directed 1 spray in the mouth or throat as needed for throat irritation / pain.      Marland Kitchen EPINEPHrine (EPI-PEN) 0.3 mg/0.3 mL DEVI Inject 0.3 mg into the muscle as needed. For allergic reactions        Review of Systems  Musculoskeletal: Positive for back pain.   Physical Exam   Blood pressure 138/70, pulse 100, temperature 97.9 F (36.6 C), temperature source Oral, resp. rate 20, height 5\' 7"  (1.702 m), weight 82.101 kg (181 lb), last menstrual period 05/10/2013, SpO2 100.00%.  Physical Exam  Nursing note and vitals reviewed. Constitutional: She is oriented to person, place, and time. She appears well-developed and well-nourished. No distress.  Cardiovascular: Normal rate.   Respiratory: Effort normal.  GI: Soft. There is no tenderness.  Genitourinary:  No CVA tenderness  Musculoskeletal:  No tenderness along the back There is muscle tension in the thoracic area  Neurological: She is alert and oriented to person, place, and time.  Skin: Skin is warm and dry.  Psychiatric: She has a normal mood and affect.  MAU Course  Procedures  Patient given toradol and heating pad.   Assessment and Plan   1. Muscle strain    Back exercises reviewed Continue heat and rest as needed Flexeril PRN  Follow-up Information   Follow up with [facility]. (As scheduled)    Specialty:  Obstetrics and Gynecology   Contact information:   [place] [address] [phone]       Mathis Bud 05/21/2013, 7:08 AM

## 2013-05-22 ENCOUNTER — Inpatient Hospital Stay (HOSPITAL_COMMUNITY)
Admission: AD | Admit: 2013-05-22 | Discharge: 2013-05-22 | Discharge status: Against Medical Advice | Payer: Self-pay | Source: Ambulatory Visit | Attending: Obstetrics & Gynecology | Admitting: Obstetrics & Gynecology

## 2013-05-22 ENCOUNTER — Encounter (HOSPITAL_COMMUNITY): Payer: Self-pay

## 2013-05-22 DIAGNOSIS — IMO0001 Reserved for inherently not codable concepts without codable children: Secondary | ICD-10-CM | POA: Insufficient documentation

## 2013-05-22 DIAGNOSIS — IMO0002 Reserved for concepts with insufficient information to code with codable children: Secondary | ICD-10-CM

## 2013-05-22 DIAGNOSIS — M549 Dorsalgia, unspecified: Secondary | ICD-10-CM | POA: Insufficient documentation

## 2013-05-22 DIAGNOSIS — M7918 Myalgia, other site: Secondary | ICD-10-CM

## 2013-05-22 DIAGNOSIS — F17200 Nicotine dependence, unspecified, uncomplicated: Secondary | ICD-10-CM | POA: Insufficient documentation

## 2013-05-22 LAB — RAPID URINE DRUG SCREEN, HOSP PERFORMED
AMPHETAMINES: NOT DETECTED
BENZODIAZEPINES: NOT DETECTED
Barbiturates: NOT DETECTED
Cocaine: NOT DETECTED
OPIATES: NOT DETECTED
Tetrahydrocannabinol: NOT DETECTED

## 2013-05-22 LAB — URINALYSIS, ROUTINE W REFLEX MICROSCOPIC
Bilirubin Urine: NEGATIVE
GLUCOSE, UA: NEGATIVE mg/dL
Hgb urine dipstick: NEGATIVE
Ketones, ur: NEGATIVE mg/dL
LEUKOCYTES UA: NEGATIVE
Nitrite: NEGATIVE
PH: 6 (ref 5.0–8.0)
Protein, ur: NEGATIVE mg/dL
Specific Gravity, Urine: 1.025 (ref 1.005–1.030)
Urobilinogen, UA: 0.2 mg/dL (ref 0.0–1.0)

## 2013-05-22 MED ORDER — IBUPROFEN 600 MG PO TABS: 600.0000 mg | ORAL_TABLET | Freq: Four times a day (QID) | ORAL | Status: DC | PRN

## 2013-05-22 MED ORDER — KETOROLAC TROMETHAMINE 60 MG/2ML IM SOLN
60.0000 mg | Freq: Once | INTRAMUSCULAR | Status: AC
  Administered 2013-05-22: 60 mg via INTRAMUSCULAR
  Filled 2013-05-22: qty 2

## 2013-05-22 NOTE — MAU Provider Note (Signed)
Chief Complaint: Back Pain   First Provider Initiated Contact with Patient 05/22/13 0426     SUBJECTIVE HPI: Brandy Stone is a 48 y.o. G1P0010 non-pregnant female who presents with mid back pain tonight 10/10 on pain scale. (Points between shoulder blades.) Seen last night for same. Requesting more Toradol. Reports lifting and going up and down from her attic yesterday. No Ob/Gyn complaints tonight. States she has Hx fibroids, passed a clot last week. No bleeding or abd pain now. Cancelled or no-showed all 6 appointment at [facility] [place] in past year.      Past Medical History  Diagnosis Date  . Asthma   . PTSD (post-traumatic stress disorder)   . Hx of cervical cancer   . HPV (human papilloma virus) infection         Bipolar disorder  OB History  Gravida Para Term Preterm AB SAB TAB Ectopic Multiple Living  1    1  1        # Outcome Date GA Lbr Len/2nd Weight Sex Delivery Anes PTL Lv  1 TAB              Past Surgical History  Procedure Laterality Date  . Mandible reconstruction     History   Social History  . Marital Status: Legally Separated    Spouse Name: N/A    Number of Children: N/A  . Years of Education: N/A   Occupational History  . Not on file.   Social History Main Topics  . Smoking status: Current Some Day Smoker    Types: Cigarettes  . Smokeless tobacco: Not on file  . Alcohol Use: Yes     Comment: social  . Drug Use: No  . Sexual Activity: Yes    Birth Control/ Protection: None   Other Topics Concern  . Not on file   Social History Narrative  . No narrative on file   No current facility-administered medications on file prior to encounter.   Current Outpatient Prescriptions on File Prior to Encounter  Medication Sig Dispense Refill  . albuterol (PROVENTIL HFA;VENTOLIN HFA) 108 (90 BASE) MCG/ACT inhaler Inhale 2 puffs into the lungs every 6 (six) hours as needed. For shortness of breath      . cyclobenzaprine (FLEXERIL)  10 MG tablet Take 1 tablet (10 mg total) by mouth once.  30 tablet  0  . oxyCODONE-acetaminophen (PERCOCET/ROXICET) 5-325 MG per tablet Take 1-2 tablets by mouth every 4 (four) hours as needed for severe pain.  15 tablet  0  . phenol (CHLORASEPTIC) 1.4 % LIQD Use as directed 1 spray in the mouth or throat as needed for throat irritation / pain.      Marland Kitchen EPINEPHrine (EPI-PEN) 0.3 mg/0.3 mL DEVI Inject 0.3 mg into the muscle as needed. For allergic reactions      . ibuprofen (ADVIL,MOTRIN) 600 MG tablet Take 1 tablet (600 mg total) by mouth every 6 (six) hours as needed for pain.  28 tablet  0   Allergies  Allergen Reactions  . Food Anaphylaxis    Multiple foods trigger patients asthma ,some lead to anaphylactic shock   . Sulfa Antibiotics Rash    ROS: Pertinent items in HPI. Unable to complete due to pt agitation.   OBJECTIVE Blood pressure 137/74, pulse 68, temperature 98.4 F (36.9 C), temperature source Oral, resp. rate 20, last menstrual period 05/10/2013. GENERAL: Well-developed, well-nourished female in no acute physical distress. Displaying manic behavior-- Pressured speech, showing staff her undergarments unnecessarily,  pacing.  HEENT: Normocephalic HEART: normal rate RESP: normal effort Remainder of exam not possible due to pt agitation.   LAB RESULTS Results for orders placed during the hospital encounter of 05/22/13 (from the past 24 hour(s))  URINALYSIS, ROUTINE W REFLEX MICROSCOPIC     Status: None   Collection Time    05/22/13  4:02 AM      Result Value Ref Range   Color, Urine YELLOW  YELLOW   APPearance CLEAR  CLEAR   Specific Gravity, Urine 1.025  1.005 - 1.030   pH 6.0  5.0 - 8.0   Glucose, UA NEGATIVE  NEGATIVE mg/dL   Hgb urine dipstick NEGATIVE  NEGATIVE   Bilirubin Urine NEGATIVE  NEGATIVE   Ketones, ur NEGATIVE  NEGATIVE mg/dL   Protein, ur NEGATIVE  NEGATIVE mg/dL   Urobilinogen, UA 0.2  0.0 - 1.0 mg/dL   Nitrite NEGATIVE  NEGATIVE   Leukocytes, UA  NEGATIVE  NEGATIVE   UPT neg 05/21/2013  IMAGING NA  MAU COURSE Toradol ordered. Warm compress given. Explained to pt that MAU staff specializes in Ob/Gyn emergencies. Not ideal for continuing mid/upper back pain. VSS. UA neg and no urinary complaints. Doubt kidney infection or stones. No N/V. Doubt radiating pain from gal stones. Offered pt transfer to [facility] ED for further eval. Declines.   ASSESSMENT 1. Suspect musculoskeletal pain, but assessment not complete. No emergent condition evident.  2.   Suspect untreated psychiatric disorder.   PLAN Discharge home in stable condition. UDS pending. Follow-up Information   Follow up with [facility] [place]. (As needed, If symptoms worsen)    Contact information:   [address] [address] [phone]      Follow up with [facility] (for management of fibroids.)    Specialty:  Obstetrics and Gynecology   Contact information:   [place] [address] [phone]      Follow up with [facility] ED. (As needed in emergencies)    Contact information:   [place] [phone]       Follow up with [place]. (As needed for gynecologic and obstetric emergencies)    Contact information:   [address] [id number] [place] [address] [phone]       Medication List         albuterol 108 (90 BASE) MCG/ACT inhaler  Commonly known as:  PROVENTIL HFA;VENTOLIN HFA  Inhale 2 puffs into the lungs every 6 (six) hours as needed. For shortness of breath     cyclobenzaprine 10 MG tablet  Commonly known as:  FLEXERIL  Take 1 tablet (10 mg total) by mouth once.     EPINEPHrine 0.3 mg/0.3 mL Devi  Commonly known as:  EPI-PEN  Inject 0.3 mg into the muscle as needed. For allergic reactions     ibuprofen 600 MG tablet  Commonly known as:  ADVIL,MOTRIN  Take 1 tablet (600 mg total) by mouth  every 6 (six) hours as needed for moderate pain.     oxyCODONE-acetaminophen 5-325 MG per tablet  Commonly known as:  PERCOCET/ROXICET  Take 1-2 tablets by mouth every 4 (four) hours as needed for severe pain.     phenol 1.4 % Liqd  Commonly known as:  CHLORASEPTIC  Use as directed 1 spray in the mouth or throat as needed for throat irritation / pain.       [place], [place] 05/22/2013  5:19  AM     

## 2013-05-22 NOTE — MAU Note (Signed)
Complains of back pain.  

## 2013-05-22 NOTE — MAU Provider Note (Signed)
Attestation of Attending Supervision of Advanced Practitioner (CNM/NP): Evaluation and management procedures were performed by the Advanced Practitioner under my supervision and collaboration.  I have reviewed the Advanced Practitioner's note and chart, and I agree with the management and plan.  HARRAWAY-SMITH, Gissele Narducci 6:43 AM

## 2013-05-22 NOTE — MAU Note (Signed)
Pt with [facility] discussing her care in radiology waiting room.

## 2013-05-22 NOTE — MAU Note (Addendum)
Pt stating pain 10/10. Walked to [employer] area. Walking around unit and standing at nurses station stating "it helps to stretch out."

## 2013-05-22 NOTE — MAU Note (Addendum)
Pt left without receiving discharge papers, prescriptions, or signing after speaking with house coverage

## 2013-05-22 NOTE — Discharge Instructions (Signed)
Musculoskeletal Pain Musculoskeletal pain is muscle and boney aches and pains. These pains can occur in any part of the body. Your caregiver may treat you without knowing the cause of the pain. They may treat you if blood or urine tests, X-rays, and other tests were normal.  CAUSES There is often not a definite cause or reason for these pains. These pains may be caused by a type of germ (virus). The discomfort may also come from overuse. Overuse includes working out too hard when your body is not fit. Boney aches also come from weather changes. Bone is sensitive to atmospheric pressure changes. HOME CARE INSTRUCTIONS   Ask when your test results will be ready. Make sure you get your test results.  Only take over-the-counter or prescription medicines for pain, discomfort, or fever as directed by your caregiver. If you were given medications for your condition, do not drive, operate machinery or power tools, or sign legal documents for 24 hours. Do not drink alcohol. Do not take sleeping pills or other medications that may interfere with treatment.  Continue all activities unless the activities cause more pain. When the pain lessens, slowly resume normal activities. Gradually increase the intensity and duration of the activities or exercise.  During periods of severe pain, bed rest may be helpful. Lay or sit in any position that is comfortable.  Putting ice on the injured area.  Put ice in a bag.  Place a towel between your skin and the bag.  Leave the ice on for 15 to 20 minutes, 3 to 4 times a day.  Follow up with your caregiver for continued problems and no reason can be found for the pain. If the pain becomes worse or does not go away, it may be necessary to repeat tests or do additional testing. Your caregiver may need to look further for a possible cause. SEEK IMMEDIATE MEDICAL CARE IF:  You have pain that is getting worse and is not relieved by medications.  You develop chest pain  that is associated with shortness or breath, sweating, feeling sick to your stomach (nauseous), or throw up (vomit).  Your pain becomes localized to the abdomen.  You develop any new symptoms that seem different or that concern you. MAKE SURE YOU:   Understand these instructions.  Will watch your condition.  Will get help right away if you are not doing well or get worse. Document Released: 03/27/2005 Document Revised: 06/19/2011 Document Reviewed: 11/29/2012 [facility] Patient Information 2014 [place].  Fibroids A uterine fibroid is a growth (tumor) that occurs in your uterus. This type of tumor is not cancerous and does not spread out of the uterus. You can have one or many fibroids. Fibroids can vary in size, weight, and where they grow in the uterus. Some can become quite large. Most fibroids do not require medical treatment, but some can cause pain or heavy bleeding during and between periods. CAUSES  A fibroid is the result of a single uterine cell that keeps growing (unregulated), which is different than most cells in the human body. Most cells have a control mechanism that keeps them from reproducing without control.  SIGNS AND SYMPTOMS   Bleeding.  Pelvic pain and pressure.  Bladder problems due to the size of the fibroid.  Infertility and miscarriages depending on the size and location of the fibroid. DIAGNOSIS  Uterine fibroids are diagnosed through a physical exam. Your health care provider may feel the lumpy tumors during a pelvic exam. Ultrasonography may  be done to get information regarding size, location, and number of tumors.  TREATMENT   Your health care provider may recommend watchful waiting. This involves getting the fibroid checked by your health care provider to see if it grows or shrinks.   Hormone treatment or an intrauterine device (IUD) may be prescribed.   Surgery may be needed to remove the fibroids (myomectomy) or the uterus (hysterectomy).  This depends on your situation. When fibroids interfere with fertility and a woman wants to become pregnant, a health care provider may recommend having the fibroids removed.  [place] care depends on how you were treated. In general:   Keep all follow-up appointments with your health care provider.   Only take over-the-counter or prescription medicines as directed by your health care provider. If you were prescribed a hormone treatment, take the hormone medicines exactly as directed. Do not take aspirin. It can cause bleeding.   Talk to your health care provider about taking iron pills.  If your periods are troublesome but not so heavy, lie down with your feet raised slightly above your heart. Place cold packs on your lower abdomen.   If your periods are heavy, write down the number of pads or tampons you use per month. Bring this information to your health care provider.   Include green vegetables in your diet.  SEEK IMMEDIATE MEDICAL CARE IF:  You have pelvic pain or cramps not controlled with medicines.   You have a sudden increase in pelvic pain.   You have an increase in bleeding between and during periods.   You have excessive periods and soak tampons or pads in a half hour or less.  You feel lightheaded or have fainting episodes. Document Released: 03/24/2000 Document Revised: 01/15/2013 Document Reviewed: 10/24/2012 [facility] Patient Information 2014 [place], [place].

## 2013-05-27 NOTE — MAU Provider Note (Signed)
Attestation of Attending Supervision of Advanced Practitioner (CNM/NP): Evaluation and management procedures were performed by the Advanced Practitioner under my supervision and collaboration. I have reviewed the Advanced Practitioner's note and chart, and I agree with the management and plan.  Jaquel Coomer H. 4:12 PM

## 2013-05-28 ENCOUNTER — Telehealth (HOSPITAL_COMMUNITY): Payer: Self-pay | Admitting: *Deleted

## 2013-05-29 ENCOUNTER — Encounter: Payer: Self-pay | Admitting: Obstetrics and Gynecology

## 2013-05-29 ENCOUNTER — Ambulatory Visit (INDEPENDENT_AMBULATORY_CARE_PROVIDER_SITE_OTHER): Payer: Self-pay | Admitting: Obstetrics and Gynecology

## 2013-05-29 VITALS — BP 147/96 | HR 107 | Temp 98.9°F | Ht 68.0 in | Wt 183.0 lb

## 2013-05-29 DIAGNOSIS — N938 Other specified abnormal uterine and vaginal bleeding: Secondary | ICD-10-CM

## 2013-05-29 DIAGNOSIS — Z01812 Encounter for preprocedural laboratory examination: Secondary | ICD-10-CM

## 2013-05-29 DIAGNOSIS — N949 Unspecified condition associated with female genital organs and menstrual cycle: Secondary | ICD-10-CM

## 2013-05-29 DIAGNOSIS — D259 Leiomyoma of uterus, unspecified: Secondary | ICD-10-CM

## 2013-05-29 LAB — POCT PREGNANCY, URINE: Preg Test, Ur: NEGATIVE

## 2013-05-29 NOTE — Progress Notes (Signed)
Pt thinks back pain is from fibroids, passed clot last week, states 4 tumors (golfball size)

## 2013-05-29 NOTE — Progress Notes (Signed)
Patient ID: Brandy Stone, female   DOB: 06-Aug-1965, 48 y.o.   MRN: [id number] 47 yo [id number] with LMP 04/28/2013 here for evaluation of fibroid uterus and abnormal uterine bleeding. Patient reports onset of heavy period with passage of clots along with severe dysmenorrhea for the past year or so. Patient has known fibroid uterus and presents today for evaluation and management. Patient is a poor historian in the sense that she is unable to directly answer a questions and is often hard to re-direct. She has had several MAU visit for bleeding and lower back pain which she attributes to her fibroids. Her pain is relieved by Toradol. Patient reports a history of cervical dysplasia s/p cryotherapy and LEEP. Patient thinks that her heavy bleeding is secondary to all the cervical procedures she has had done.  Past Medical History  Diagnosis Date  . Asthma   . PTSD (post-traumatic stress disorder)   . Hx of cervical cancer   . HPV (human papilloma virus) infection    Past Surgical History  Procedure Laterality Date  . Mandible reconstruction     Family History  Problem Relation Age of Onset  . Endometriosis Mother   . Hypertension Mother   . Hyperlipidemia Mother   . Cancer Father     lung  . Hyperlipidemia Father    History  Substance Use Topics  . Smoking status: Current Some Day Smoker    Types: Cigarettes  . Smokeless tobacco: Not on file  . Alcohol Use: Yes     Comment: social   GENERAL: Well-developed, well-nourished female in no acute distress.  ABDOMEN: Soft, nontender, nondistended.  PELVIC: Normal external female genitalia. Vagina is pink and rugated.  Normal discharge. Normal appearing cervix. Uterus is 12-14 week size. No adnexal mass or tenderness. EXTREMITIES: No cyanosis, clubbing, or edema, 2+ distal pulses.  05/13/2013 ultrasound FINDINGS:  Uterus  Measurements: 12 x 8 x 11 cm. There are at least 4 myometrial  masses. The largest is intramural to a sub serosal in the  left  uterine body, 5 x 3 x 3 cm. The second largest is in the right  cornual region, also involving the serosal surface, 3 x 3 x 3 cm.  There is a 2 cm mass in the uterine fundus. A 3 x 1 x 2 cm mass in  the upper left uterine body is intramural. These masses have  heterogeneous echotexture consistent with fibroids.  Endometrium  Distortion by uterine fibroids, limiting assessment of the  endometrial thickness. Sensitivity is decreased for detecting of  focal endometrial abnormality.  Right ovary  Measurements: The ovarian parenchyma is not enlarged. There is a  simple cyst, likely follicular, within the ovary measuring 3.5 x 2.6  x 2.4 cm.  Left ovary  Not seen. Exam was terminated prematurely due to patient discomfort.  Other findings  No free fluid.  IMPRESSION:  1. Fibroid uterus, as above.  2. Left ovary not seen.  3. 3.5 cm simple cyst in the right ovary, likely follicular.  Electronically Signed  By: Jorje Guild M.D.  On: 05/13/2013 23:18  A/P 48 yo with abnormal uterine bleeding and fibroid uterus - Pap smear collected today - Endometrial biopsy was attempted but had to be aborted as patient was not able to tolerate the procedure.   After informed consent was obtained, the cervix was cleaned with betadine and grasped with a single tooth tenaculum. The pipelle was inserted in the external os and patient demanded that I stopped. She started  crying, stating 10/10 pain. All instruments were removed. The patient agrees to return for endometrial biopsy.  - Discussed management of DUB associated with fibroid uterus. Patient is not interested in hormonal management. She is not interested in hysterectomy as a first choice of treatment. Endometrial ablation was discussed as an option. Patient states that she has heard of uterine artery embolization as the treatment for fibroid uterus. Informed patient that she will need to be seen in [facility] for this procedure. She informed me  that she was interested in having it done in [place] last year. She plans on following up with them to see if she is a candidate.  - I reminded the patient that it is important for her to return for endometrial biopsy to rule out endometrial ca A total of 30 minutes was spent face to face counseling this patient.

## 2013-06-09 ENCOUNTER — Encounter: Payer: Self-pay | Admitting: *Deleted

## 2013-06-18 ENCOUNTER — Telehealth: Payer: Self-pay | Admitting: *Deleted

## 2013-06-18 ENCOUNTER — Ambulatory Visit: Payer: Self-pay | Admitting: Obstetrics and Gynecology

## 2013-06-18 NOTE — Telephone Encounter (Signed)
Patient had phoned to [facility] last night.  She is requesting pain medication for endometrial biopsy appointment this afternoon with Dr. Elly Modena.  Patient had been seen previously for endometrial biopsy which was unable to be completed because patient couldn't tolerate procedure.  I spoke with Dr. Elly Modena who advised patient may have Motrin prior to appointment.  Nothing stronger is needed.  Phoned patient and left message - I am calling in response to your call to our after hours line regarding pain medication for your visit today.  You may take 600 mg of Motrin about 1 hour prior to your appointment.  If you have additional questions, please feel free to call me back.  I left my direct phone number for the patient to return my call if needed.

## 2013-06-19 ENCOUNTER — Encounter: Payer: Self-pay | Admitting: *Deleted

## 2013-06-19 ENCOUNTER — Telehealth: Payer: Self-pay | Admitting: *Deleted

## 2013-06-19 NOTE — Telephone Encounter (Signed)
Patient called in yesterday and stated she had a fever and did not feel like coming in to her appointment.  She asked that I reschedule her and call her back with the new appointment.  I just rescheduled the appointment and tried to call the patient this morning.  I got her voicemail so I left a message with the new appointment date and time.  Will send a letter also.

## 2013-06-23 ENCOUNTER — Telehealth: Payer: Self-pay | Admitting: *Deleted

## 2013-06-23 NOTE — Telephone Encounter (Signed)
I was given message to call patient by the front desk. She told them that she needs to be "put under" for her biopsy. I called her back and she stated that she was going to a meeting right now and can't talk to me. I asked that she call us back when she is ready to talk.

## 2013-06-30 ENCOUNTER — Encounter: Payer: Self-pay | Admitting: Obstetrics and Gynecology

## 2013-07-03 ENCOUNTER — Ambulatory Visit: Payer: Self-pay | Admitting: Obstetrics and Gynecology

## 2013-07-17 ENCOUNTER — Encounter: Payer: Self-pay | Admitting: Obstetrics & Gynecology

## 2013-07-17 ENCOUNTER — Ambulatory Visit (INDEPENDENT_AMBULATORY_CARE_PROVIDER_SITE_OTHER): Payer: Self-pay | Admitting: Obstetrics & Gynecology

## 2013-07-17 VITALS — BP 125/75 | HR 90 | Temp 98.3°F | Wt 181.4 lb

## 2013-07-17 DIAGNOSIS — D259 Leiomyoma of uterus, unspecified: Secondary | ICD-10-CM

## 2013-07-17 DIAGNOSIS — D219 Benign neoplasm of connective and other soft tissue, unspecified: Secondary | ICD-10-CM

## 2013-07-17 NOTE — Progress Notes (Signed)
Subjective:     Patient ID: Brandy Stone, female   DOB: [birth year], 48 y.o.   MRN: [id number]  HPI Pt with a h/o uterine fibroids was seen prev by Dr. Elly Modena.  At that time pt suggested that she was interested in a [place].  She reports that her last cycle was less heavy and she thinks that she would like to 'take the summer to eat healthy and eat right' and not do anything.  She is adverse to surgery.  She denies any new problems.   Review of Systems     Objective:   Physical Exam BP 125/75  Pulse 90  Temp(Src) 98.3 F (36.8 C)  Wt 181 lb 6.4 oz (82.283 kg)  LMP 06/27/2013 Exam deferred  05/2013 CLINICAL DATA: Vaginal bleeding. History of fibroids  EXAM:  TRANSABDOMINAL AND TRANSVAGINAL ULTRASOUND OF PELVIS  TECHNIQUE:  Both transabdominal and transvaginal ultrasound examinations of the  pelvis were performed. Transabdominal technique was performed for  global imaging of the pelvis including uterus, ovaries, adnexal  regions, and pelvic cul-de-sac. It was necessary to proceed with  endovaginal exam following the transabdominal exam to visualize the  endometrium and adnexae.  COMPARISON: 06/02/2012  FINDINGS:  Uterus  Measurements: 12 x 8 x 11 cm. There are at least 4 myometrial  masses. The largest is intramural to a sub serosal in the left  uterine body, 5 x 3 x 3 cm. The second largest is in the right  cornual region, also involving the serosal surface, 3 x 3 x 3 cm.  There is a 2 cm mass in the uterine fundus. A 3 x 1 x 2 cm mass in  the upper left uterine body is intramural. These masses have  heterogeneous echotexture consistent with fibroids.  Endometrium  Distortion by uterine fibroids, limiting assessment of the  endometrial thickness. Sensitivity is decreased for detecting of  focal endometrial abnormality.  Right ovary  Measurements: The ovarian parenchyma is not enlarged. There is a  simple cyst, likely follicular, within the ovary measuring 3.5 x 2.6  x 2.4  cm.  Left ovary  Not seen. Exam was terminated prematurely due to patient discomfort.  Other findings  No free fluid.  IMPRESSION:  1. Fibroid uterus, as above.  2. Left ovary not seen.  3. 3.5 cm simple cyst in the right ovary, likely follicular.   05/2013 Adequacy Reason Satisfactory for evaluation, endocervical/transformation zone component PRESENT. Diagnosis NEGATIVE FOR INTRAEPITHELIAL LESIONS OR MALIGNANCY. DEBRA GIBSON Cytotechnologist Electronic Signature (Case signed 06/02/2013) Source CervicoVaginal     Assessment:     Uterine fibroids.  Does not desire treatment at present.  I reviewed with her the option of [place] if she did require treatment.       Plan:     Face-to-face discussion with pt and her mother for 40min   F/u in 3 months or sooner prn

## 2013-07-17 NOTE — Patient Instructions (Addendum)
Uterine Fibroid A uterine fibroid is a growth (tumor) that occurs in your uterus. This type of tumor is not cancerous and does not spread out of the uterus. You can have one or many fibroids. Fibroids can vary in size, weight, and where they grow in the uterus. Some can become quite large. Most fibroids do not require medical treatment, but some can cause pain or heavy bleeding during and between periods. CAUSES  A fibroid is the result of a single uterine cell that keeps growing (unregulated), which is different than most cells in the human body. Most cells have a control mechanism that keeps them from reproducing without control.  SIGNS AND SYMPTOMS   Bleeding.  Pelvic pain and pressure.  Bladder problems due to the size of the fibroid.  Infertility and miscarriages depending on the size and location of the fibroid. DIAGNOSIS  Uterine fibroids are diagnosed through a physical exam. Your health care provider may feel the lumpy tumors during a pelvic exam. Ultrasonography may be done to get information regarding size, location, and number of tumors.  TREATMENT   Your health care provider may recommend watchful waiting. This involves getting the fibroid checked by your health care provider to see if it grows or shrinks.   Hormone treatment or an intrauterine device (IUD) may be prescribed.   Surgery may be needed to remove the fibroids (myomectomy) or the uterus (hysterectomy). This depends on your situation. When fibroids interfere with fertility and a woman wants to become pregnant, a health care provider may recommend having the fibroids removed.  [place] care depends on how you were treated. In general:   Keep all follow-up appointments with your health care provider.   Only take over-the-counter or prescription medicines as directed by your health care provider. If you were prescribed a hormone treatment, take the hormone medicines exactly as directed. Do not  take aspirin. It can cause bleeding.   Talk to your health care provider about taking iron pills.  If your periods are troublesome but not so heavy, lie down with your feet raised slightly above your heart. Place cold packs on your lower abdomen.   If your periods are heavy, write down the number of pads or tampons you use per month. Bring this information to your health care provider.   Include green vegetables in your diet.  SEEK IMMEDIATE MEDICAL CARE IF:  You have pelvic pain or cramps not controlled with medicines.   You have a sudden increase in pelvic pain.   You have an increase in bleeding between and during periods.   You have excessive periods and soak tampons or pads in a half hour or less.  You feel lightheaded or have fainting episodes. Document Released: 03/24/2000 Document Revised: 01/15/2013 Document Reviewed: 10/24/2012 [facility] Patient Information 2014 [place], [place]. Uterine Artery Embolization for Fibroids Uterine artery embolization is a nonsurgical treatment to shrink fibroids. A thin plastic tube (catheter) is used to inject material that blocks off the blood supply to the fibroid, which causes the fibroid to shrink. LET [facility] A CARE PROVIDER KNOW ABOUT:  Any allergies you have.  All medicines you are taking, including vitamins, herbs, eye drops, creams, and over-the-counter medicines.  Previous problems you or members of your family have had with the use of anesthetics.  Any blood disorders you have.  Previous surgeries you have had.  Medical conditions you have. RISKS AND COMPLICATIONS  Injury to the uterus from decreased blood supply  Infection.  Blood infection (septicemia).  Lack of menstrual periods (amenorrhea).  Death of tissue cells (necrosis) around your bladder or vulva.  Development of a hole between organs or from an organ to the surface of your skin (fistula).  Blood clot in the legs (deep vein thrombosis) or lung  (pulmonary embolus). BEFORE THE PROCEDURE  Ask your health care provider about changing or stopping your regular medicines.   Do not take aspirin or blood thinners (anticoagulants) for 1 week before the surgery or as directed by your health care provider.  Do not eat or drink anything for 8 hours before the surgery or as directed by your health care provider.   Empty your bladder before the procedure begins. PROCEDURE   An IV tube will be placed into one of your veins. This will be used to give you a sedative and pain medication (conscious sedation).  You will be given a medicine that numbs the area (local anesthetic).  A small cut will be made in your groin. A catheter is then inserted into the main artery of your leg.  The catheter will be guided through the artery to your uterus. A series of images will be taken while dye is injected through the catheter in your groin. X-rays are taken at the same time. This is done to provide a road map of the blood supply to your uterus and fibroids.  Tiny plastic spheres, about the size of sand grains, will be injected through the catheter. Metal coils may be used to help block the artery. The particles will lodge in tiny branches of the uterine artery that supplies blood to the fibroids.  The procedure is repeated on the artery that supplies the other side of the uterus.  The catheter is then removed and pressure is held to stop any bleeding. No stitches are needed.  A dressing is then placed over the cut (incision). AFTER THE PROCEDURE  You will be taken to a recovery area where your progress will be monitored until you are awake, stable, and taking fluids well. If there are no other problems, you will then be moved to a regular hospital room.  You will be observed overnight in the hospital.  You will have cramping that should be controlled with pain medication. Document Released: 06/12/2005 Document Revised: 01/15/2013 Document Reviewed:  10/10/2012 [facility] Patient Information 2014 [place], [place].

## 2014-02-09 ENCOUNTER — Encounter: Payer: Self-pay | Admitting: Obstetrics & Gynecology

## 2014-06-10 ENCOUNTER — Ambulatory Visit: Payer: Self-pay | Admitting: Obstetrics & Gynecology

## 2014-07-15 ENCOUNTER — Encounter: Payer: Self-pay | Admitting: Obstetrics & Gynecology

## 2014-07-15 ENCOUNTER — Ambulatory Visit (INDEPENDENT_AMBULATORY_CARE_PROVIDER_SITE_OTHER): Payer: 59 | Admitting: Obstetrics & Gynecology

## 2014-07-15 VITALS — BP 131/86 | HR 86 | Temp 98.1°F | Ht 68.0 in | Wt 174.7 lb

## 2014-07-15 DIAGNOSIS — Z1151 Encounter for screening for human papillomavirus (HPV): Secondary | ICD-10-CM

## 2014-07-15 DIAGNOSIS — Z0000 Encounter for general adult medical examination without abnormal findings: Secondary | ICD-10-CM | POA: Diagnosis not present

## 2014-07-15 DIAGNOSIS — Z01419 Encounter for gynecological examination (general) (routine) without abnormal findings: Secondary | ICD-10-CM

## 2014-07-15 DIAGNOSIS — D259 Leiomyoma of uterus, unspecified: Secondary | ICD-10-CM | POA: Diagnosis not present

## 2014-07-15 DIAGNOSIS — Z124 Encounter for screening for malignant neoplasm of cervix: Secondary | ICD-10-CM

## 2014-07-15 LAB — POCT URINALYSIS DIP (DEVICE)
Bilirubin Urine: NEGATIVE
GLUCOSE, UA: NEGATIVE mg/dL
HGB URINE DIPSTICK: NEGATIVE
KETONES UR: NEGATIVE mg/dL
Leukocytes, UA: NEGATIVE
Nitrite: NEGATIVE
Protein, ur: NEGATIVE mg/dL
Specific Gravity, Urine: 1.01 (ref 1.005–1.030)
Urobilinogen, UA: 0.2 mg/dL (ref 0.0–1.0)
pH: 6.5 (ref 5.0–8.0)

## 2014-07-15 NOTE — Patient Instructions (Signed)
Mammography Mammography is an X-ray of the breasts to look for changes that are not normal. The X-ray image is called a mammogram. This procedure can screen for breast cancer, can detect cancer early, and can diagnose cancer.  LET YOUR CAREGIVER KNOW ABOUT:  Breast implants.  Previous breast disease, biopsy, or surgery.  If you are breastfeeding.  Medicines taken, including vitamins, herbs, eyedrops, over-the-counter medicines, and creams.  Use of steroids (by mouth or creams).  Possibility of pregnancy, if this applies. RISKS AND COMPLICATIONS  Exposure to radiation, but at very low levels.  The results may be misinterpreted.  The results may not be accurate.  Mammography may lead to further tests.  Mammography may not catch certain cancers. BEFORE THE PROCEDURE  Schedule your test about 7 days after your menstrual period. This is when your breasts are the least tender and have signs of hormone changes.  If you have had a mammography done at a different facility in the past, get the mammogram X-rays or have them sent to your current exam facility in order to compare them.  Wash your breasts and under your arms the day of the test.  Do not wear deodorants, perfumes, or powders anywhere on your body.  Wear clothes that you can change in and out of easily. PROCEDURE Relax as much as possible during the test. Any discomfort during the test will be very brief. The test should take less than 30 minutes. The following will happen:  You will undress from the waist up and put on a gown.  You will stand in front of the X-ray machine.  Each breast will be placed between 2 plastic or glass plates. The plates will compress your breast for a few seconds.  X-rays will be taken from different angles of the breast. AFTER THE PROCEDURE  The mammogram will be examined.  Depending on the quality of the images, you may need to repeat certain parts of the test.  Ask when your test  results will be ready. Make sure you get your test results.  You may resume normal activities. Document Released: 03/24/2000 Document Revised: 06/19/2011 Document Reviewed: 01/15/2011 [employer] Patient Information 2015 [employer]. This information is not intended to replace advice given to you by your health care provider. Make sure you discuss any questions you have with your health care provider.  

## 2014-07-15 NOTE — Progress Notes (Signed)
Patient ID: Brandy Stone, female   DOB: 02-Dec-1965, 49 y.o.   MRN: [id number] Subjective:     Brandy Stone is a 49 y.o. female here for a routine exam.  Current complaints: no GYN complaints.  She reports monthly cycles. Occ missed cycles.  No hot flushes. No weight loss.  Never had mammogram because 'breast cancer does not run in my family'  Denies pelvic pain.  Reports 4 fibroids in uterus.  No urinary freq or dysuria.  Normal passage of stools.   Pt denies heavy bleeding.  Using 'towels' during her cycle to preserve the environment.  No clots or prolonged bleeding.   Gynecologic History Patient's last menstrual period was 06/16/2014. Contraception: none Last Pap: 05/2013. Results were: normal Last mammogram: never had. Results were: never had  Obstetric History OB History  Gravida Para Term Preterm AB SAB TAB Ectopic Multiple Living  1    1  1        # Outcome Date GA Lbr Len/2nd Weight Sex Delivery Anes PTL Lv  1 TAB              Past Surgical History  Procedure Laterality Date  . Mandible reconstruction  2000  . Rhinoplasty  1991  . Tooth      crown placement x3  . Cryotheraphy    . Cervix surgery  1989    not sure if it was a LEEP  . Skin dysplasia      several lesions removed - precancerous   Past Medical History  Diagnosis Date  . Asthma   . PTSD (post-traumatic stress disorder)   . Hx of cervical cancer   . HPV (human papilloma virus) infection   . Vaginal Pap smear, abnormal   . Anemia   . Herpes simplex virus (HSV) infection   . Infertility, female   . Trauma       The following portions of the patient's history were reviewed and updated as appropriate: allergies, current medications, past family history, past medical history, past social history, past surgical history and problem list.  Review of Systems Pertinent items are noted in HPI.    Objective:    BP 131/86 mmHg  Pulse 86  Temp(Src) 98.1 F (36.7 C)  Ht 5\' 8"  (1.727 m)  Wt 174 lb  11.2 oz (79.243 kg)  BMI 26.57 kg/m2  LMP 06/16/2014  General Appearance:    Alert, cooperative, no distress, appears stated age  Head:    Normocephalic, without obvious abnormality, atraumatic  Eyes:    nonicteric           Neck:   Supple, symmetrical, trachea midline, no adenopathy;    thyroid:  no enlargement/tenderness/nodules; no carotid   bruit or JVD  Back:     Symmetric, no curvature, ROM normal, no CVA tenderness  Lungs:     Clear to auscultation bilaterally, respirations unlabored  Chest Wall:    No tenderness or deformity   Heart:    Regular rate and rhythm, S1 and S2 normal, no murmur, rub   or gallop  Breast Exam:    No tenderness, masses, or nipple abnormality  Abdomen:     Soft, non-tender, bowel sounds active all four quadrants,    no masses, no organomegaly  Genitalia:    Normal female without lesion, discharge or tenderness: fibroid uterus palpated ~14 weeks sized; mobile.  Rectal:  Not done  Extremities:   Extremities normal, atraumatic, no cyanosis or edema  Pulses:   2+  and symmetric all extremities  Skin:   Skin color, texture, turgor normal, no rashes or lesions    05/2013 CLINICAL DATA: Vaginal bleeding. History of fibroids  EXAM: TRANSABDOMINAL AND TRANSVAGINAL ULTRASOUND OF PELVIS  TECHNIQUE: Both transabdominal and transvaginal ultrasound examinations of the pelvis were performed. Transabdominal technique was performed for global imaging of the pelvis including uterus, ovaries, adnexal regions, and pelvic cul-de-sac. It was necessary to proceed with endovaginal exam following the transabdominal exam to visualize the endometrium and adnexae.  COMPARISON: 06/02/2012  FINDINGS: Uterus  Measurements: 12 x 8 x 11 cm. There are at least 4 myometrial masses. The largest is intramural to a sub serosal in the left uterine body, 5 x 3 x 3 cm. The second largest is in the right cornual region, also involving the serosal surface, 3 x 3 x 3  cm. There is a 2 cm mass in the uterine fundus. A 3 x 1 x 2 cm mass in the upper left uterine body is intramural. These masses have heterogeneous echotexture consistent with fibroids.  Endometrium  Distortion by uterine fibroids, limiting assessment of the endometrial thickness. Sensitivity is decreased for detecting of focal endometrial abnormality.  Right ovary  Measurements: The ovarian parenchyma is not enlarged. There is a simple cyst, likely follicular, within the ovary measuring 3.5 x 2.6 x 2.4 cm.  Left ovary  Not seen. Exam was terminated prematurely due to patient discomfort.  Other findings  No free fluid.  IMPRESSION: 1. Fibroid uterus, as above. 2. Left ovary not seen. 3. 3.5 cm simple cyst in the right ovary, likely follicular.  Assessment:    Healthy female exam.   Enlarged uterus due to fibroids-  Pt asymptomatic at present so no treatment indicated at present Pt has never had a mammogram- educated to the importance of screening for early potential detection of disease     Plan:    Mammogram ordered.    F/u in 1 year or sooner prn

## 2014-07-15 NOTE — Progress Notes (Signed)
States she is here for annual exam, does not want endometrial biopsy as discussed at last visit.  Wants urinalysis ran but denies burning or pain with urination-states she wants urinalysis ran because she is here and to check for sugar.

## 2014-07-16 LAB — CYTOLOGY - PAP

## 2014-07-28 ENCOUNTER — Ambulatory Visit (HOSPITAL_COMMUNITY): Payer: 59

## 2014-08-20 ENCOUNTER — Ambulatory Visit (HOSPITAL_COMMUNITY)
Admission: RE | Admit: 2014-08-20 | Discharge: 2014-08-20 | Disposition: A | Discharge status: Home / Self Care | Payer: 59 | Source: Ambulatory Visit | Attending: Obstetrics & Gynecology | Admitting: Obstetrics & Gynecology

## 2014-08-20 DIAGNOSIS — Z0000 Encounter for general adult medical examination without abnormal findings: Secondary | ICD-10-CM

## 2014-08-20 DIAGNOSIS — Z1231 Encounter for screening mammogram for malignant neoplasm of breast: Secondary | ICD-10-CM | POA: Insufficient documentation

## 2014-10-22 ENCOUNTER — Emergency Department (HOSPITAL_COMMUNITY)
Admission: EM | Admit: 2014-10-22 | Discharge: 2014-10-23 | Disposition: A | Discharge status: Home / Self Care | Payer: 59 | Attending: Emergency Medicine | Admitting: Emergency Medicine

## 2014-10-22 ENCOUNTER — Encounter (HOSPITAL_COMMUNITY): Payer: Self-pay | Admitting: *Deleted

## 2014-10-22 DIAGNOSIS — Z8541 Personal history of malignant neoplasm of cervix uteri: Secondary | ICD-10-CM | POA: Insufficient documentation

## 2014-10-22 DIAGNOSIS — R21 Rash and other nonspecific skin eruption: Secondary | ICD-10-CM | POA: Diagnosis not present

## 2014-10-22 DIAGNOSIS — Z862 Personal history of diseases of the blood and blood-forming organs and certain disorders involving the immune mechanism: Secondary | ICD-10-CM | POA: Insufficient documentation

## 2014-10-22 DIAGNOSIS — Z79899 Other long term (current) drug therapy: Secondary | ICD-10-CM | POA: Diagnosis not present

## 2014-10-22 DIAGNOSIS — Z8659 Personal history of other mental and behavioral disorders: Secondary | ICD-10-CM | POA: Insufficient documentation

## 2014-10-22 DIAGNOSIS — J45909 Unspecified asthma, uncomplicated: Secondary | ICD-10-CM | POA: Insufficient documentation

## 2014-10-22 DIAGNOSIS — Z8619 Personal history of other infectious and parasitic diseases: Secondary | ICD-10-CM | POA: Diagnosis not present

## 2014-10-22 DIAGNOSIS — Z791 Long term (current) use of non-steroidal anti-inflammatories (NSAID): Secondary | ICD-10-CM | POA: Diagnosis not present

## 2014-10-22 DIAGNOSIS — Z8742 Personal history of other diseases of the female genital tract: Secondary | ICD-10-CM | POA: Diagnosis not present

## 2014-10-22 DIAGNOSIS — Z87828 Personal history of other (healed) physical injury and trauma: Secondary | ICD-10-CM | POA: Diagnosis not present

## 2014-10-22 DIAGNOSIS — Z720 Tobacco use: Secondary | ICD-10-CM | POA: Diagnosis not present

## 2014-10-22 NOTE — ED Notes (Signed)
Pt reports exposure to poison [place] x 1 week, called her PCP and was given prednisone without relief.  Pt reports today, she called her PCP today d/t rash getting worse, was given prescription for steroid cream which only made her skin burn.  Hives and redness noted all over her body.

## 2014-10-23 MED ORDER — KETOROLAC TROMETHAMINE 30 MG/ML IJ SOLN: 30.0000 mg | Freq: Once | INTRAMUSCULAR | Status: DC

## 2014-10-23 MED ORDER — FAMOTIDINE IN NACL 20-0.9 MG/50ML-% IV SOLN
20.0000 mg | Freq: Once | INTRAVENOUS | Status: AC
  Administered 2014-10-23: 20 mg via INTRAVENOUS
  Filled 2014-10-23: qty 50

## 2014-10-23 MED ORDER — ACETAMINOPHEN 325 MG PO TABS
ORAL_TABLET | ORAL | Status: AC
  Administered 2014-10-23: 650 mg
  Filled 2014-10-23: qty 2

## 2014-10-23 MED ORDER — SODIUM CHLORIDE 0.9 % IV BOLUS (SEPSIS)
1000.0000 mL | Freq: Once | INTRAVENOUS | Status: AC
  Administered 2014-10-23: 1000 mL via INTRAVENOUS

## 2014-10-23 MED ORDER — DIPHENHYDRAMINE HCL 50 MG/ML IJ SOLN
25.0000 mg | Freq: Once | INTRAMUSCULAR | Status: AC
  Administered 2014-10-23: 25 mg via INTRAVENOUS
  Filled 2014-10-23: qty 1

## 2014-10-23 MED ORDER — METHYLPREDNISOLONE SODIUM SUCC 125 MG IJ SOLR
125.0000 mg | Freq: Once | INTRAMUSCULAR | Status: AC
  Administered 2014-10-23: 125 mg via INTRAVENOUS
  Filled 2014-10-23: qty 2

## 2014-10-23 NOTE — Discharge Instructions (Signed)
Follow up with your doctor for further evaluation. Return to the ED with worsening or concerning symptoms.  °

## 2014-10-23 NOTE — ED Notes (Signed)
Pt has generalized rash on arms. Legs, chest, and face pt reports exposure to [place] and current steriods received fro PMD have been ineffective.

## 2014-10-23 NOTE — ED Provider Notes (Signed)
CSN: [id number]     Arrival date & time 10/22/14  2337 History   First MD Initiated Contact with Patient 10/22/14 2352     Chief Complaint  Patient presents with  . Rash     (Consider location/radiation/quality/duration/timing/severity/associated sxs/prior Treatment) Patient is a 49 y.o. female presenting with rash. The history is provided by the patient. No language interpreter was used.  Rash Location:  Full body Quality: itchiness and redness   Severity:  Moderate Onset quality:  Gradual Duration:  1 week Timing:  Constant Progression:  Worsening Chronicity:  New Context: plant contact   Context: not animal contact, not chemical exposure, not eggs, not exposure to similar rash, not hot tub use, not medications, not new detergent/soap, not sick contacts and not sun exposure   Relieved by:  Nothing Worsened by:  Heat Ineffective treatments:  Anti-itch cream and topical steroids Associated symptoms: no abdominal pain, no fatigue, no induration, no tongue swelling and no URI     Past Medical History  Diagnosis Date  . Asthma   . PTSD (post-traumatic stress disorder)   . Hx of cervical cancer   . HPV (human papilloma virus) infection   . Vaginal Pap smear, abnormal   . Anemia   . Herpes simplex virus (HSV) infection   . Infertility, female   . Trauma    Past Surgical History  Procedure Laterality Date  . Mandible reconstruction  2000  . Rhinoplasty  1991  . Tooth      crown placement x3  . Cryotheraphy    . Cervix surgery  1989    not sure if it was a LEEP  . Skin dysplasia      several lesions removed - precancerous   Family History  Problem Relation Age of Onset  . Endometriosis Mother   . Hypertension Mother   . Hyperlipidemia Mother   . Cancer Father     lung  . Hyperlipidemia Father   . Heart disease Mother    History  Substance Use Topics  . Smoking status: Current Some Day Smoker    Types: Cigarettes  . Smokeless tobacco: Never Used  . Alcohol  Use: Yes     Comment: social   OB History    Gravida Para Term Preterm AB TAB SAB Ectopic Multiple Living   1    1 1          Review of Systems  Constitutional: Negative for fatigue.  Gastrointestinal: Negative for abdominal pain.  Skin: Positive for rash.  All other systems reviewed and are negative.     Allergies  Food and Sulfa antibiotics  Home Medications   Prior to Admission medications   Medication Sig Start Date End Date Taking? Authorizing Provider  albuterol (PROVENTIL HFA;VENTOLIN HFA) 108 (90 BASE) MCG/ACT inhaler Inhale 2 puffs into the lungs every 6 (six) hours as needed. For shortness of breath    Historical Provider, MD  cyclobenzaprine (FLEXERIL) 10 MG tablet Take 1 tablet (10 mg total) by mouth once. 05/14/13   Luvenia Redden, PA-C  EPINEPHrine (EPI-PEN) 0.3 mg/0.3 mL DEVI Inject 0.3 mg into the muscle as needed. For allergic reactions    Historical Provider, MD  LORazepam (ATIVAN) 0.5 MG tablet  05/14/14   Historical Provider, MD  Multiple Vitamin (MULTIVITAMIN) tablet Take 1 tablet by mouth daily.    Historical Provider, MD  naproxen sodium (ANAPROX) 220 MG tablet Take 220 mg by mouth 2 (two) times daily with a meal.    Historical  Provider, MD  phenol (CHLORASEPTIC) 1.4 % LIQD Use as directed 1 spray in the mouth or throat as needed for throat irritation / pain.    Historical Provider, MD   BP 129/80 mmHg  Pulse 104  Temp(Src) 98.6 F (37 C) (Oral)  Resp 17  SpO2 98%  LMP 10/15/2014 Physical Exam  Constitutional: She is oriented to person, place, and time. She appears well-developed and well-nourished. No distress.  HENT:  Head: Normocephalic and atraumatic.  Eyes: Conjunctivae and EOM are normal.  Neck: Normal range of motion.  Cardiovascular: Normal rate and regular rhythm.  Exam reveals no gallop and no friction rub.   No murmur heard. Pulmonary/Chest: Effort normal and breath sounds normal. She has no wheezes. She has no rales. She exhibits no  tenderness.  Abdominal: Soft. She exhibits no distension. There is no tenderness. There is no rebound.  Musculoskeletal: Normal range of motion.  Neurological: She is alert and oriented to person, place, and time. Coordination normal.  Speech is goal-oriented. Moves limbs without ataxia.   Skin: Skin is warm and dry.  Scattered erythematous macules with overlying excoriations of bilateral legs, abdomen, bilateral arms, and ears. No open wounds.   Psychiatric: She has a normal mood and affect. Her behavior is normal.  Nursing note and vitals reviewed.   ED Course  Procedures (including critical care time) Labs Review Labs Reviewed - No data to display  Imaging Review No results found.   EKG Interpretation None      MDM   Final diagnoses:  Rash    12:42 AM Patient will have solumedrol, benadryl, and pepcid.   1:55 AM Patient reports improvement with IV medication. Vitals stable and patient afebrile.   Alvina Chou, PA-C 10/23/14 0155  April Palumbo, MD 10/23/14 0600

## 2015-07-12 DIAGNOSIS — F3181 Bipolar II disorder: Secondary | ICD-10-CM | POA: Diagnosis not present

## 2015-08-04 DIAGNOSIS — F3181 Bipolar II disorder: Secondary | ICD-10-CM | POA: Diagnosis not present

## 2015-08-16 DIAGNOSIS — F3181 Bipolar II disorder: Secondary | ICD-10-CM | POA: Diagnosis not present

## 2015-08-30 DIAGNOSIS — F3181 Bipolar II disorder: Secondary | ICD-10-CM | POA: Diagnosis not present

## 2015-10-25 DIAGNOSIS — F3181 Bipolar II disorder: Secondary | ICD-10-CM | POA: Diagnosis not present

## 2015-11-15 DIAGNOSIS — F3181 Bipolar II disorder: Secondary | ICD-10-CM | POA: Diagnosis not present

## 2015-11-29 DIAGNOSIS — F3181 Bipolar II disorder: Secondary | ICD-10-CM | POA: Diagnosis not present

## 2015-12-14 DIAGNOSIS — F3181 Bipolar II disorder: Secondary | ICD-10-CM | POA: Diagnosis not present

## 2015-12-14 DIAGNOSIS — M9901 Segmental and somatic dysfunction of cervical region: Secondary | ICD-10-CM | POA: Diagnosis not present

## 2015-12-14 DIAGNOSIS — M9903 Segmental and somatic dysfunction of lumbar region: Secondary | ICD-10-CM | POA: Diagnosis not present

## 2015-12-16 DIAGNOSIS — M9901 Segmental and somatic dysfunction of cervical region: Secondary | ICD-10-CM | POA: Diagnosis not present

## 2015-12-16 DIAGNOSIS — M9903 Segmental and somatic dysfunction of lumbar region: Secondary | ICD-10-CM | POA: Diagnosis not present

## 2015-12-21 DIAGNOSIS — M9901 Segmental and somatic dysfunction of cervical region: Secondary | ICD-10-CM | POA: Diagnosis not present

## 2015-12-21 DIAGNOSIS — M9903 Segmental and somatic dysfunction of lumbar region: Secondary | ICD-10-CM | POA: Diagnosis not present

## 2015-12-22 DIAGNOSIS — Z6828 Body mass index (BMI) 28.0-28.9, adult: Secondary | ICD-10-CM | POA: Diagnosis not present

## 2015-12-22 DIAGNOSIS — H9201 Otalgia, right ear: Secondary | ICD-10-CM | POA: Diagnosis not present

## 2015-12-23 DIAGNOSIS — M9901 Segmental and somatic dysfunction of cervical region: Secondary | ICD-10-CM | POA: Diagnosis not present

## 2015-12-23 DIAGNOSIS — M9903 Segmental and somatic dysfunction of lumbar region: Secondary | ICD-10-CM | POA: Diagnosis not present

## 2015-12-27 DIAGNOSIS — F3181 Bipolar II disorder: Secondary | ICD-10-CM | POA: Diagnosis not present

## 2015-12-28 DIAGNOSIS — M9903 Segmental and somatic dysfunction of lumbar region: Secondary | ICD-10-CM | POA: Diagnosis not present

## 2015-12-28 DIAGNOSIS — M9901 Segmental and somatic dysfunction of cervical region: Secondary | ICD-10-CM | POA: Diagnosis not present

## 2015-12-30 DIAGNOSIS — M9901 Segmental and somatic dysfunction of cervical region: Secondary | ICD-10-CM | POA: Diagnosis not present

## 2015-12-30 DIAGNOSIS — M9903 Segmental and somatic dysfunction of lumbar region: Secondary | ICD-10-CM | POA: Diagnosis not present

## 2016-01-07 DIAGNOSIS — M9901 Segmental and somatic dysfunction of cervical region: Secondary | ICD-10-CM | POA: Diagnosis not present

## 2016-01-07 DIAGNOSIS — M9903 Segmental and somatic dysfunction of lumbar region: Secondary | ICD-10-CM | POA: Diagnosis not present

## 2016-01-10 DIAGNOSIS — F3181 Bipolar II disorder: Secondary | ICD-10-CM | POA: Diagnosis not present

## 2016-01-11 DIAGNOSIS — M9903 Segmental and somatic dysfunction of lumbar region: Secondary | ICD-10-CM | POA: Diagnosis not present

## 2016-01-11 DIAGNOSIS — M9901 Segmental and somatic dysfunction of cervical region: Secondary | ICD-10-CM | POA: Diagnosis not present

## 2016-01-13 DIAGNOSIS — M9901 Segmental and somatic dysfunction of cervical region: Secondary | ICD-10-CM | POA: Diagnosis not present

## 2016-01-13 DIAGNOSIS — M9903 Segmental and somatic dysfunction of lumbar region: Secondary | ICD-10-CM | POA: Diagnosis not present

## 2016-01-17 DIAGNOSIS — M9901 Segmental and somatic dysfunction of cervical region: Secondary | ICD-10-CM | POA: Diagnosis not present

## 2016-01-17 DIAGNOSIS — M9903 Segmental and somatic dysfunction of lumbar region: Secondary | ICD-10-CM | POA: Diagnosis not present

## 2016-02-14 DIAGNOSIS — M9901 Segmental and somatic dysfunction of cervical region: Secondary | ICD-10-CM | POA: Diagnosis not present

## 2016-02-14 DIAGNOSIS — M9903 Segmental and somatic dysfunction of lumbar region: Secondary | ICD-10-CM | POA: Diagnosis not present

## 2016-02-17 DIAGNOSIS — M9901 Segmental and somatic dysfunction of cervical region: Secondary | ICD-10-CM | POA: Diagnosis not present

## 2016-02-17 DIAGNOSIS — F3181 Bipolar II disorder: Secondary | ICD-10-CM | POA: Diagnosis not present

## 2016-02-17 DIAGNOSIS — M9903 Segmental and somatic dysfunction of lumbar region: Secondary | ICD-10-CM | POA: Diagnosis not present

## 2016-02-25 DIAGNOSIS — F411 Generalized anxiety disorder: Secondary | ICD-10-CM | POA: Diagnosis not present

## 2016-03-09 DIAGNOSIS — M9901 Segmental and somatic dysfunction of cervical region: Secondary | ICD-10-CM | POA: Diagnosis not present

## 2016-03-09 DIAGNOSIS — M9903 Segmental and somatic dysfunction of lumbar region: Secondary | ICD-10-CM | POA: Diagnosis not present

## 2016-03-15 DIAGNOSIS — F411 Generalized anxiety disorder: Secondary | ICD-10-CM | POA: Diagnosis not present

## 2016-04-24 DIAGNOSIS — R739 Hyperglycemia, unspecified: Secondary | ICD-10-CM | POA: Diagnosis not present

## 2016-04-24 DIAGNOSIS — Z0000 Encounter for general adult medical examination without abnormal findings: Secondary | ICD-10-CM | POA: Diagnosis not present

## 2016-05-03 DIAGNOSIS — R6884 Jaw pain: Secondary | ICD-10-CM | POA: Diagnosis not present

## 2016-05-03 DIAGNOSIS — F319 Bipolar disorder, unspecified: Secondary | ICD-10-CM | POA: Diagnosis not present

## 2016-05-03 DIAGNOSIS — Z0000 Encounter for general adult medical examination without abnormal findings: Secondary | ICD-10-CM | POA: Diagnosis not present

## 2016-05-03 DIAGNOSIS — R3121 Asymptomatic microscopic hematuria: Secondary | ICD-10-CM | POA: Diagnosis not present

## 2016-05-03 DIAGNOSIS — Z1389 Encounter for screening for other disorder: Secondary | ICD-10-CM | POA: Diagnosis not present

## 2016-05-03 DIAGNOSIS — L988 Other specified disorders of the skin and subcutaneous tissue: Secondary | ICD-10-CM | POA: Diagnosis not present

## 2016-05-18 DIAGNOSIS — G43109 Migraine with aura, not intractable, without status migrainosus: Secondary | ICD-10-CM | POA: Diagnosis not present

## 2016-05-23 DIAGNOSIS — F3181 Bipolar II disorder: Secondary | ICD-10-CM | POA: Diagnosis not present

## 2016-08-09 DIAGNOSIS — F3181 Bipolar II disorder: Secondary | ICD-10-CM | POA: Diagnosis not present

## 2016-08-25 DIAGNOSIS — M9903 Segmental and somatic dysfunction of lumbar region: Secondary | ICD-10-CM | POA: Diagnosis not present

## 2016-08-25 DIAGNOSIS — M9901 Segmental and somatic dysfunction of cervical region: Secondary | ICD-10-CM | POA: Diagnosis not present

## 2016-09-06 DIAGNOSIS — F3181 Bipolar II disorder: Secondary | ICD-10-CM | POA: Diagnosis not present

## 2016-09-08 DIAGNOSIS — F411 Generalized anxiety disorder: Secondary | ICD-10-CM | POA: Diagnosis not present

## 2016-09-21 DIAGNOSIS — M9901 Segmental and somatic dysfunction of cervical region: Secondary | ICD-10-CM | POA: Diagnosis not present

## 2016-09-21 DIAGNOSIS — M9903 Segmental and somatic dysfunction of lumbar region: Secondary | ICD-10-CM | POA: Diagnosis not present

## 2016-09-22 DIAGNOSIS — M722 Plantar fascial fibromatosis: Secondary | ICD-10-CM | POA: Diagnosis not present

## 2016-09-22 DIAGNOSIS — Z6829 Body mass index (BMI) 29.0-29.9, adult: Secondary | ICD-10-CM | POA: Diagnosis not present

## 2016-09-22 DIAGNOSIS — L988 Other specified disorders of the skin and subcutaneous tissue: Secondary | ICD-10-CM | POA: Diagnosis not present

## 2016-09-27 DIAGNOSIS — M9901 Segmental and somatic dysfunction of cervical region: Secondary | ICD-10-CM | POA: Diagnosis not present

## 2016-09-27 DIAGNOSIS — M9903 Segmental and somatic dysfunction of lumbar region: Secondary | ICD-10-CM | POA: Diagnosis not present

## 2016-10-30 DIAGNOSIS — M79671 Pain in right foot: Secondary | ICD-10-CM | POA: Diagnosis not present

## 2016-10-30 DIAGNOSIS — M545 Low back pain: Secondary | ICD-10-CM | POA: Diagnosis not present

## 2016-10-30 DIAGNOSIS — M542 Cervicalgia: Secondary | ICD-10-CM | POA: Diagnosis not present

## 2016-10-30 DIAGNOSIS — R6884 Jaw pain: Secondary | ICD-10-CM | POA: Diagnosis not present

## 2016-11-02 DIAGNOSIS — R6884 Jaw pain: Secondary | ICD-10-CM | POA: Diagnosis not present

## 2016-11-02 DIAGNOSIS — M545 Low back pain: Secondary | ICD-10-CM | POA: Diagnosis not present

## 2016-11-02 DIAGNOSIS — M79671 Pain in right foot: Secondary | ICD-10-CM | POA: Diagnosis not present

## 2016-11-02 DIAGNOSIS — M542 Cervicalgia: Secondary | ICD-10-CM | POA: Diagnosis not present

## 2016-11-07 DIAGNOSIS — M542 Cervicalgia: Secondary | ICD-10-CM | POA: Diagnosis not present

## 2016-11-07 DIAGNOSIS — R6884 Jaw pain: Secondary | ICD-10-CM | POA: Diagnosis not present

## 2016-11-07 DIAGNOSIS — M79671 Pain in right foot: Secondary | ICD-10-CM | POA: Diagnosis not present

## 2016-11-07 DIAGNOSIS — M545 Low back pain: Secondary | ICD-10-CM | POA: Diagnosis not present

## 2016-11-22 DIAGNOSIS — F4323 Adjustment disorder with mixed anxiety and depressed mood: Secondary | ICD-10-CM | POA: Diagnosis not present

## 2016-12-07 DIAGNOSIS — F4323 Adjustment disorder with mixed anxiety and depressed mood: Secondary | ICD-10-CM | POA: Diagnosis not present

## 2016-12-19 DIAGNOSIS — M9901 Segmental and somatic dysfunction of cervical region: Secondary | ICD-10-CM | POA: Diagnosis not present

## 2016-12-19 DIAGNOSIS — M9903 Segmental and somatic dysfunction of lumbar region: Secondary | ICD-10-CM | POA: Diagnosis not present

## 2016-12-27 DIAGNOSIS — F4323 Adjustment disorder with mixed anxiety and depressed mood: Secondary | ICD-10-CM | POA: Diagnosis not present

## 2017-03-06 DIAGNOSIS — F411 Generalized anxiety disorder: Secondary | ICD-10-CM | POA: Diagnosis not present

## 2017-03-06 DIAGNOSIS — F331 Major depressive disorder, recurrent, moderate: Secondary | ICD-10-CM | POA: Diagnosis not present

## 2017-03-08 DIAGNOSIS — F4323 Adjustment disorder with mixed anxiety and depressed mood: Secondary | ICD-10-CM | POA: Diagnosis not present

## 2017-03-13 DIAGNOSIS — F4323 Adjustment disorder with mixed anxiety and depressed mood: Secondary | ICD-10-CM | POA: Diagnosis not present

## 2017-03-16 DIAGNOSIS — F4323 Adjustment disorder with mixed anxiety and depressed mood: Secondary | ICD-10-CM | POA: Diagnosis not present

## 2017-03-21 DIAGNOSIS — F4321 Adjustment disorder with depressed mood: Secondary | ICD-10-CM | POA: Diagnosis not present

## 2017-03-23 DIAGNOSIS — F4323 Adjustment disorder with mixed anxiety and depressed mood: Secondary | ICD-10-CM | POA: Diagnosis not present

## 2017-05-01 DIAGNOSIS — Z0000 Encounter for general adult medical examination without abnormal findings: Secondary | ICD-10-CM | POA: Diagnosis not present

## 2017-05-01 DIAGNOSIS — R7309 Other abnormal glucose: Secondary | ICD-10-CM | POA: Diagnosis not present

## 2017-05-07 DIAGNOSIS — R5383 Other fatigue: Secondary | ICD-10-CM | POA: Diagnosis not present

## 2017-05-07 DIAGNOSIS — E7849 Other hyperlipidemia: Secondary | ICD-10-CM | POA: Diagnosis not present

## 2017-05-07 DIAGNOSIS — Z0000 Encounter for general adult medical examination without abnormal findings: Secondary | ICD-10-CM | POA: Diagnosis not present

## 2017-05-07 DIAGNOSIS — Z23 Encounter for immunization: Secondary | ICD-10-CM | POA: Diagnosis not present

## 2017-05-07 DIAGNOSIS — D3610 Benign neoplasm of peripheral nerves and autonomic nervous system, unspecified: Secondary | ICD-10-CM | POA: Diagnosis not present

## 2017-05-07 DIAGNOSIS — Z1389 Encounter for screening for other disorder: Secondary | ICD-10-CM | POA: Diagnosis not present

## 2017-05-08 DIAGNOSIS — D3610 Benign neoplasm of peripheral nerves and autonomic nervous system, unspecified: Secondary | ICD-10-CM | POA: Diagnosis not present

## 2017-05-08 DIAGNOSIS — Z0000 Encounter for general adult medical examination without abnormal findings: Secondary | ICD-10-CM | POA: Diagnosis not present

## 2017-05-08 DIAGNOSIS — R5383 Other fatigue: Secondary | ICD-10-CM | POA: Diagnosis not present

## 2017-05-14 DIAGNOSIS — M9901 Segmental and somatic dysfunction of cervical region: Secondary | ICD-10-CM | POA: Diagnosis not present

## 2017-05-14 DIAGNOSIS — M9903 Segmental and somatic dysfunction of lumbar region: Secondary | ICD-10-CM | POA: Diagnosis not present

## 2017-05-16 DIAGNOSIS — M9901 Segmental and somatic dysfunction of cervical region: Secondary | ICD-10-CM | POA: Diagnosis not present

## 2017-05-16 DIAGNOSIS — M9903 Segmental and somatic dysfunction of lumbar region: Secondary | ICD-10-CM | POA: Diagnosis not present

## 2017-05-18 DIAGNOSIS — M9901 Segmental and somatic dysfunction of cervical region: Secondary | ICD-10-CM | POA: Diagnosis not present

## 2017-05-18 DIAGNOSIS — M9903 Segmental and somatic dysfunction of lumbar region: Secondary | ICD-10-CM | POA: Diagnosis not present

## 2017-05-21 ENCOUNTER — Encounter: Payer: Self-pay | Admitting: Gastroenterology

## 2017-05-21 DIAGNOSIS — M9903 Segmental and somatic dysfunction of lumbar region: Secondary | ICD-10-CM | POA: Diagnosis not present

## 2017-05-21 DIAGNOSIS — M9901 Segmental and somatic dysfunction of cervical region: Secondary | ICD-10-CM | POA: Diagnosis not present

## 2017-05-24 DIAGNOSIS — M9901 Segmental and somatic dysfunction of cervical region: Secondary | ICD-10-CM | POA: Diagnosis not present

## 2017-05-24 DIAGNOSIS — M9903 Segmental and somatic dysfunction of lumbar region: Secondary | ICD-10-CM | POA: Diagnosis not present

## 2017-05-29 DIAGNOSIS — M9901 Segmental and somatic dysfunction of cervical region: Secondary | ICD-10-CM | POA: Diagnosis not present

## 2017-05-29 DIAGNOSIS — M9903 Segmental and somatic dysfunction of lumbar region: Secondary | ICD-10-CM | POA: Diagnosis not present

## 2017-05-31 DIAGNOSIS — M9903 Segmental and somatic dysfunction of lumbar region: Secondary | ICD-10-CM | POA: Diagnosis not present

## 2017-05-31 DIAGNOSIS — M9901 Segmental and somatic dysfunction of cervical region: Secondary | ICD-10-CM | POA: Diagnosis not present

## 2017-06-01 ENCOUNTER — Ambulatory Visit: Payer: BLUE CROSS/BLUE SHIELD | Admitting: Podiatry

## 2017-06-01 ENCOUNTER — Ambulatory Visit (INDEPENDENT_AMBULATORY_CARE_PROVIDER_SITE_OTHER): Payer: BLUE CROSS/BLUE SHIELD

## 2017-06-01 ENCOUNTER — Other Ambulatory Visit: Payer: Self-pay | Admitting: Podiatry

## 2017-06-01 ENCOUNTER — Encounter: Payer: Self-pay | Admitting: Podiatry

## 2017-06-01 DIAGNOSIS — M722 Plantar fascial fibromatosis: Secondary | ICD-10-CM

## 2017-06-01 DIAGNOSIS — M216X9 Other acquired deformities of unspecified foot: Secondary | ICD-10-CM | POA: Diagnosis not present

## 2017-06-01 DIAGNOSIS — M79671 Pain in right foot: Secondary | ICD-10-CM

## 2017-06-01 DIAGNOSIS — M79672 Pain in left foot: Secondary | ICD-10-CM

## 2017-06-01 NOTE — Progress Notes (Signed)
   Subjective:    Patient ID: Brandy Stone, female    DOB: [birth year], 52 y.o.   MRN: [id number]  HPI    Review of Systems  All other systems reviewed and are negative.      Objective:   Physical Exam        Assessment & Plan:

## 2017-06-07 DIAGNOSIS — M9903 Segmental and somatic dysfunction of lumbar region: Secondary | ICD-10-CM | POA: Diagnosis not present

## 2017-06-07 DIAGNOSIS — M9901 Segmental and somatic dysfunction of cervical region: Secondary | ICD-10-CM | POA: Diagnosis not present

## 2017-06-11 DIAGNOSIS — M9901 Segmental and somatic dysfunction of cervical region: Secondary | ICD-10-CM | POA: Diagnosis not present

## 2017-06-11 DIAGNOSIS — M9903 Segmental and somatic dysfunction of lumbar region: Secondary | ICD-10-CM | POA: Diagnosis not present

## 2017-06-14 DIAGNOSIS — Z113 Encounter for screening for infections with a predominantly sexual mode of transmission: Secondary | ICD-10-CM | POA: Diagnosis not present

## 2017-06-14 DIAGNOSIS — N760 Acute vaginitis: Secondary | ICD-10-CM | POA: Diagnosis not present

## 2017-06-14 DIAGNOSIS — Z01419 Encounter for gynecological examination (general) (routine) without abnormal findings: Secondary | ICD-10-CM | POA: Diagnosis not present

## 2017-06-14 DIAGNOSIS — Z6825 Body mass index (BMI) 25.0-25.9, adult: Secondary | ICD-10-CM | POA: Diagnosis not present

## 2017-06-19 DIAGNOSIS — M9901 Segmental and somatic dysfunction of cervical region: Secondary | ICD-10-CM | POA: Diagnosis not present

## 2017-06-19 DIAGNOSIS — M9903 Segmental and somatic dysfunction of lumbar region: Secondary | ICD-10-CM | POA: Diagnosis not present

## 2017-06-22 DIAGNOSIS — M9903 Segmental and somatic dysfunction of lumbar region: Secondary | ICD-10-CM | POA: Diagnosis not present

## 2017-06-22 DIAGNOSIS — M9901 Segmental and somatic dysfunction of cervical region: Secondary | ICD-10-CM | POA: Diagnosis not present

## 2017-06-25 DIAGNOSIS — M9901 Segmental and somatic dysfunction of cervical region: Secondary | ICD-10-CM | POA: Diagnosis not present

## 2017-06-25 DIAGNOSIS — M9903 Segmental and somatic dysfunction of lumbar region: Secondary | ICD-10-CM | POA: Diagnosis not present

## 2017-06-29 DIAGNOSIS — N852 Hypertrophy of uterus: Secondary | ICD-10-CM | POA: Diagnosis not present

## 2017-06-29 DIAGNOSIS — D259 Leiomyoma of uterus, unspecified: Secondary | ICD-10-CM | POA: Diagnosis not present

## 2017-07-11 ENCOUNTER — Ambulatory Visit: Payer: Self-pay | Admitting: Gastroenterology

## 2017-07-12 ENCOUNTER — Encounter: Payer: Self-pay | Admitting: Gastroenterology

## 2017-07-12 ENCOUNTER — Ambulatory Visit: Payer: BLUE CROSS/BLUE SHIELD | Admitting: Gastroenterology

## 2017-07-12 VITALS — BP 110/74 | HR 76 | Ht 68.0 in | Wt 179.0 lb

## 2017-07-12 DIAGNOSIS — Z1211 Encounter for screening for malignant neoplasm of colon: Secondary | ICD-10-CM

## 2017-07-12 DIAGNOSIS — Z8371 Family history of colonic polyps: Secondary | ICD-10-CM

## 2017-07-12 MED ORDER — SUPREP BOWEL PREP KIT 17.5-3.13-1.6 GM/177ML PO SOLN: 1.0000 | ORAL | 0 refills | Status: DC

## 2017-07-12 NOTE — Patient Instructions (Signed)
You have been scheduled for a colonoscopy. Please follow written instructions given to you at your visit today.  Please pick up your prep supplies at the pharmacy within the next 1-3 days. If you use inhalers (even only as needed), please bring them with you on the day of your procedure. Your physician has requested that you go to www.startemmi.com and enter the access code given to you at your visit today. This web site gives a general overview about your procedure. However, you should still follow specific instructions given to you by our office regarding your preparation for the procedure.  If you are age 31 or older, your body mass index should be between 23-30. Your Body mass index is 27.22 kg/m. If this is out of the aforementioned range listed, please consider follow up with your Primary Care Provider.  If you are age 64 or younger, your body mass index should be between 19-25. Your Body mass index is 27.22 kg/m. If this is out of the aformentioned range listed, please consider follow up with your Primary Care Provider.

## 2017-07-18 NOTE — Progress Notes (Addendum)
Brandy Stone    [id number]    03-21-1966  Primary Care Physician:Patient, [name]  Referring Physician: Velna Hatchet, MD [address] [place], [place] [address]  Chief complaint: Discuss screening colonoscopy HPI: 52 year old female with history of chronic intermittent constipation is here to discuss colorectal cancer screening.  She is accompanied by her mother.  Denies any rectal bleeding, loss of appetite, recent change in bowel habits or weight loss.  No family history of colon cancer.  Her mother had large adenomatous colonic polyps removed .  Patient not pay any attention, easily distracted, constantly joking around, and was not interested in providing any history.  Most of it was obtained from her mother.  Outpatient Encounter Medications as of 07/12/2017  Medication Sig  . buPROPion (WELLBUTRIN XL) 150 MG 24 hr tablet Take 1 tablet by mouth daily.  . Cholecalciferol (VITAMIN D3) 2000 units TABS Take 1 tablet by mouth daily.  Marland Kitchen EPINEPHrine (EPI-PEN) 0.3 mg/0.3 mL DEVI Inject 0.3 mg into the muscle as needed. For allergic reactions  . LORazepam (ATIVAN) 0.5 MG tablet Take 0.5 mg by mouth as needed.   . Multiple Vitamin (MULTIVITAMIN) tablet Take 1 tablet by mouth daily.  . naproxen sodium (ANAPROX) 220 MG tablet Take 220 mg by mouth 2 (two) times daily as needed.   Marland Kitchen albuterol (PROVENTIL HFA;VENTOLIN HFA) 108 (90 BASE) MCG/ACT inhaler Inhale 2 puffs into the lungs every 6 (six) hours as needed. For shortness of breath  . cyclobenzaprine (FLEXERIL) 10 MG tablet Take 1 tablet (10 mg total) by mouth once. (Patient not taking: Reported on 07/12/2017)  . SUPREP BOWEL PREP KIT 17.5-3.13-1.6 GM/177ML SOLN Take 1 kit by mouth as directed.  . [DISCONTINUED] phenol (CHLORASEPTIC) 1.4 % LIQD Use as directed 1 spray in the mouth or throat as needed for throat irritation / pain.   No facility-administered encounter medications on file as of 07/12/2017.     Allergies as of  07/12/2017 - Review Complete 07/12/2017  Allergen Reaction Noted  . Food Anaphylaxis 04/25/2012  . Sulfa antibiotics Rash 04/25/2012    Past Medical History:  Diagnosis Date  . Anemia   . Asthma   . Herpes simplex virus (HSV) infection   . HPV (human papilloma virus) infection   . Hx of cervical cancer   . Infertility, female   . PTSD (post-traumatic stress disorder)   . Trauma   . Vaginal Pap smear, abnormal     Past Surgical History:  Procedure Laterality Date  . [place]   not sure if it was a LEEP  . cryotheraphy    . MANDIBLE RECONSTRUCTION  2000  . RHINOPLASTY  1991  . Skin dysplasia     several lesions removed - precancerous  . tooth     crown placement x3    Family History  Problem Relation Age of Onset  . Endometriosis Mother   . Hypertension Mother   . Hyperlipidemia Mother   . Hyperlipidemia Father   . Lung cancer Father   . Melanoma Father   . Colon cancer Neg Hx   . Esophageal cancer Neg Hx   . Pancreatic cancer Neg Hx   . Stomach cancer Neg Hx   . Liver disease Neg Hx     Social History   Socioeconomic History  . Marital status: Divorced    Spouse name: Not on file  . Number of children: Not on file  .  Years of education: Not on file  . Highest education level: Not on file  Occupational History  . Not on file  Social Needs  . Financial resource strain: Not on file  . Food insecurity:    Worry: Not on file    Inability: Not on file  . Transportation needs:    Medical: Not on file    Non-medical: Not on file  Tobacco Use  . Smoking status: Current Some Day Smoker    Types: Cigarettes  . Smokeless tobacco: Never Used  Substance and Sexual Activity  . Alcohol use: Yes    Comment: social  . Drug use: No  . Sexual activity: Yes    Birth control/protection: None  Lifestyle  . Physical activity:    Days per week: Not on file    Minutes per session: Not on file  . Stress: Not on file  Relationships  . Social  connections:    Talks on phone: Not on file    Gets together: Not on file    Attends religious service: Not on file    Active member of club or organization: Not on file    Attends meetings of clubs or organizations: Not on file    Relationship status: Not on file  . Intimate partner violence:    Fear of current or ex partner: Not on file    Emotionally abused: Not on file    Physically abused: Not on file    Forced sexual activity: Not on file  Other Topics Concern  . Not on file  Social History Narrative  . Not on file      Review of systems: Review of Systems  Constitutional: Negative for fever and chills.  HENT: Negative.   Eyes: Negative for blurred vision.  Respiratory: Negative for cough, shortness of breath and wheezing.   Cardiovascular: Negative for chest pain and palpitations.  Gastrointestinal: as per HPI Genitourinary: Negative for dysuria, urgency, frequency and hematuria.  Musculoskeletal: Negative for myalgias, back pain and joint pain.  Skin: Negative for itching and rash.  Neurological: Negative for dizziness, tremors, focal weakness, seizures and loss of consciousness.  Endo/Heme/Allergies: Positive for seasonal allergies.  Psychiatric/Behavioral: Negative for depression, suicidal ideas and hallucinations.  All other systems reviewed and are negative.   Physical Exam: Vitals:   07/12/17 0942  BP: 110/74  Pulse: 76   Body mass index is 27.22 kg/m. Gen:      No acute distress HEENT:  EOMI, sclera anicteric Neck:     No masses; no thyromegaly Lungs:    Clear to auscultation bilaterally; normal respiratory effort CV:         Regular rate and rhythm; no murmurs Abd:      + bowel sounds; soft, non-tender; no palpable masses, no distension Ext:    No edema; adequate peripheral perfusion Skin:      Warm and dry; no rash Neuro: alert and oriented x 3 Psych: normal mood and affect  Data Reviewed:  Reviewed labs, radiology imaging, old records and  pertinent past GI work up   Assessment and Plan/Recommendations: 52 year old female with history of posttraumatic stress disorder is here to discuss colorectal cancer screening Given history of large adenomatous advanced polyps in her mother, recommended proceeding with colonoscopy The risks and benefits as well as alternatives of endoscopic procedure(s) have been discussed and reviewed. All questions answered. The patient agrees to proceed.   Damaris Hippo , MD [phone]    CC: Brandy Hatchet, MD

## 2017-07-23 NOTE — Progress Notes (Signed)
Subjective:   Patient ID: Brandy Stone, female   DOB: 52 y.o.   MRN: [id number]   HPI Patient presents stating that she has had trouble with the mid arch area bilateral and is tried custom orthotics and Aleve at this point.  Patient states that this is been gradually more aggravating for her and she does take Naprosyn at times.  Patient likes to be active and does not smoke    Review of Systems  All other systems reviewed and are negative.       Objective:  Physical Exam  Constitutional: She appears well-developed and well-nourished.  Cardiovascular: Intact distal pulses.  Pulmonary/Chest: Effort normal.  Musculoskeletal: Normal range of motion.  Neurological: She is alert.  Skin: Skin is warm.  Nursing note and vitals reviewed.   Neurovascular status found to be intact muscle strength adequate range of motion within normal limits with patient found to have moderate discomfort in the mid arch area bilateral with inflammation fluid around the mid arch area.  There is localized tenderness but not severe and there is moderate depression of the arch upon weightbearing     Assessment:  Fasciitis-like symptomatology left over right     Plan:  H&P condition reviewed and I recommended utilization of aggressive home physical therapy which was written out for her along with shoe gear modifications and I reviewed x-rays with her.  Patient will be seen back on an as-needed basis and may require a new type of orthotic for possible injection treatment or possible surgery if symptoms continue to persist  X-rays indicate that there is no indications of significant bone structural pathology or arthritis with moderate depression of the arch

## 2017-08-02 DIAGNOSIS — M9903 Segmental and somatic dysfunction of lumbar region: Secondary | ICD-10-CM | POA: Diagnosis not present

## 2017-08-02 DIAGNOSIS — M9901 Segmental and somatic dysfunction of cervical region: Secondary | ICD-10-CM | POA: Diagnosis not present

## 2017-08-17 DIAGNOSIS — M9903 Segmental and somatic dysfunction of lumbar region: Secondary | ICD-10-CM | POA: Diagnosis not present

## 2017-08-17 DIAGNOSIS — M9901 Segmental and somatic dysfunction of cervical region: Secondary | ICD-10-CM | POA: Diagnosis not present

## 2017-08-20 ENCOUNTER — Encounter: Payer: Self-pay | Admitting: Gastroenterology

## 2017-08-20 DIAGNOSIS — F4323 Adjustment disorder with mixed anxiety and depressed mood: Secondary | ICD-10-CM | POA: Diagnosis not present

## 2017-08-21 DIAGNOSIS — D225 Melanocytic nevi of trunk: Secondary | ICD-10-CM | POA: Diagnosis not present

## 2017-08-21 DIAGNOSIS — L814 Other melanin hyperpigmentation: Secondary | ICD-10-CM | POA: Diagnosis not present

## 2017-08-21 DIAGNOSIS — B009 Herpesviral infection, unspecified: Secondary | ICD-10-CM | POA: Diagnosis not present

## 2017-08-30 ENCOUNTER — Encounter: Payer: BLUE CROSS/BLUE SHIELD | Admitting: Gastroenterology

## 2017-09-07 DIAGNOSIS — F4323 Adjustment disorder with mixed anxiety and depressed mood: Secondary | ICD-10-CM | POA: Diagnosis not present

## 2017-09-11 DIAGNOSIS — F4323 Adjustment disorder with mixed anxiety and depressed mood: Secondary | ICD-10-CM | POA: Diagnosis not present

## 2017-09-17 DIAGNOSIS — N951 Menopausal and female climacteric states: Secondary | ICD-10-CM | POA: Diagnosis not present

## 2017-09-19 DIAGNOSIS — F4323 Adjustment disorder with mixed anxiety and depressed mood: Secondary | ICD-10-CM | POA: Diagnosis not present

## 2017-09-20 DIAGNOSIS — F411 Generalized anxiety disorder: Secondary | ICD-10-CM | POA: Diagnosis not present

## 2017-09-20 DIAGNOSIS — F900 Attention-deficit hyperactivity disorder, predominantly inattentive type: Secondary | ICD-10-CM | POA: Diagnosis not present

## 2017-10-03 DIAGNOSIS — F4323 Adjustment disorder with mixed anxiety and depressed mood: Secondary | ICD-10-CM | POA: Diagnosis not present

## 2017-10-05 DIAGNOSIS — M9901 Segmental and somatic dysfunction of cervical region: Secondary | ICD-10-CM | POA: Diagnosis not present

## 2017-10-05 DIAGNOSIS — M9903 Segmental and somatic dysfunction of lumbar region: Secondary | ICD-10-CM | POA: Diagnosis not present

## 2018-04-22 DIAGNOSIS — M9901 Segmental and somatic dysfunction of cervical region: Secondary | ICD-10-CM | POA: Diagnosis not present

## 2018-04-22 DIAGNOSIS — M9903 Segmental and somatic dysfunction of lumbar region: Secondary | ICD-10-CM | POA: Diagnosis not present

## 2018-04-24 DIAGNOSIS — F419 Anxiety disorder, unspecified: Secondary | ICD-10-CM | POA: Insufficient documentation

## 2018-04-24 DIAGNOSIS — R87619 Unspecified abnormal cytological findings in specimens from cervix uteri: Secondary | ICD-10-CM | POA: Insufficient documentation

## 2018-04-24 DIAGNOSIS — Z780 Asymptomatic menopausal state: Secondary | ICD-10-CM | POA: Diagnosis not present

## 2018-04-24 DIAGNOSIS — N951 Menopausal and female climacteric states: Secondary | ICD-10-CM | POA: Diagnosis not present

## 2018-04-24 DIAGNOSIS — F329 Major depressive disorder, single episode, unspecified: Secondary | ICD-10-CM | POA: Diagnosis not present

## 2018-04-24 DIAGNOSIS — Z79890 Hormone replacement therapy (postmenopausal): Secondary | ICD-10-CM | POA: Diagnosis not present

## 2018-04-24 DIAGNOSIS — K589 Irritable bowel syndrome without diarrhea: Secondary | ICD-10-CM | POA: Insufficient documentation

## 2018-04-24 DIAGNOSIS — F32A Depression, unspecified: Secondary | ICD-10-CM | POA: Insufficient documentation

## 2018-04-24 DIAGNOSIS — J45909 Unspecified asthma, uncomplicated: Secondary | ICD-10-CM | POA: Insufficient documentation

## 2018-04-24 DIAGNOSIS — B009 Herpesviral infection, unspecified: Secondary | ICD-10-CM | POA: Insufficient documentation

## 2018-04-25 DIAGNOSIS — F4323 Adjustment disorder with mixed anxiety and depressed mood: Secondary | ICD-10-CM | POA: Diagnosis not present

## 2018-04-30 DIAGNOSIS — M9903 Segmental and somatic dysfunction of lumbar region: Secondary | ICD-10-CM | POA: Diagnosis not present

## 2018-04-30 DIAGNOSIS — M9901 Segmental and somatic dysfunction of cervical region: Secondary | ICD-10-CM | POA: Diagnosis not present

## 2018-05-01 DIAGNOSIS — F4323 Adjustment disorder with mixed anxiety and depressed mood: Secondary | ICD-10-CM | POA: Diagnosis not present

## 2018-05-06 DIAGNOSIS — F411 Generalized anxiety disorder: Secondary | ICD-10-CM | POA: Diagnosis not present

## 2018-05-07 DIAGNOSIS — R7989 Other specified abnormal findings of blood chemistry: Secondary | ICD-10-CM | POA: Diagnosis not present

## 2018-05-07 DIAGNOSIS — R82998 Other abnormal findings in urine: Secondary | ICD-10-CM | POA: Diagnosis not present

## 2018-05-07 DIAGNOSIS — R5383 Other fatigue: Secondary | ICD-10-CM | POA: Diagnosis not present

## 2018-05-07 DIAGNOSIS — Z0000 Encounter for general adult medical examination without abnormal findings: Secondary | ICD-10-CM | POA: Diagnosis not present

## 2018-05-07 DIAGNOSIS — E7849 Other hyperlipidemia: Secondary | ICD-10-CM | POA: Diagnosis not present

## 2018-05-15 DIAGNOSIS — F331 Major depressive disorder, recurrent, moderate: Secondary | ICD-10-CM | POA: Diagnosis not present

## 2018-05-15 DIAGNOSIS — R7989 Other specified abnormal findings of blood chemistry: Secondary | ICD-10-CM | POA: Diagnosis not present

## 2018-05-15 DIAGNOSIS — M797 Fibromyalgia: Secondary | ICD-10-CM | POA: Diagnosis not present

## 2018-05-15 DIAGNOSIS — Z0000 Encounter for general adult medical examination without abnormal findings: Secondary | ICD-10-CM | POA: Diagnosis not present

## 2018-05-15 DIAGNOSIS — E7849 Other hyperlipidemia: Secondary | ICD-10-CM | POA: Diagnosis not present

## 2018-05-15 DIAGNOSIS — Z23 Encounter for immunization: Secondary | ICD-10-CM | POA: Diagnosis not present

## 2018-05-15 DIAGNOSIS — Z1331 Encounter for screening for depression: Secondary | ICD-10-CM | POA: Diagnosis not present

## 2018-05-17 DIAGNOSIS — Z1212 Encounter for screening for malignant neoplasm of rectum: Secondary | ICD-10-CM | POA: Diagnosis not present

## 2018-07-24 DIAGNOSIS — F4322 Adjustment disorder with anxiety: Secondary | ICD-10-CM | POA: Diagnosis not present

## 2018-07-26 DIAGNOSIS — F4323 Adjustment disorder with mixed anxiety and depressed mood: Secondary | ICD-10-CM | POA: Diagnosis not present

## 2018-07-29 DIAGNOSIS — F4322 Adjustment disorder with anxiety: Secondary | ICD-10-CM | POA: Diagnosis not present

## 2018-07-31 DIAGNOSIS — F4322 Adjustment disorder with anxiety: Secondary | ICD-10-CM | POA: Diagnosis not present

## 2018-07-31 DIAGNOSIS — F411 Generalized anxiety disorder: Secondary | ICD-10-CM | POA: Diagnosis not present

## 2018-08-01 DIAGNOSIS — M9903 Segmental and somatic dysfunction of lumbar region: Secondary | ICD-10-CM | POA: Diagnosis not present

## 2018-08-01 DIAGNOSIS — M9901 Segmental and somatic dysfunction of cervical region: Secondary | ICD-10-CM | POA: Diagnosis not present

## 2018-08-01 DIAGNOSIS — F4322 Adjustment disorder with anxiety: Secondary | ICD-10-CM | POA: Diagnosis not present

## 2018-08-07 ENCOUNTER — Other Ambulatory Visit: Payer: Self-pay

## 2018-08-07 ENCOUNTER — Encounter: Payer: Self-pay | Admitting: Podiatry

## 2018-08-07 ENCOUNTER — Other Ambulatory Visit: Payer: Self-pay | Admitting: *Deleted

## 2018-08-07 ENCOUNTER — Telehealth (INDEPENDENT_AMBULATORY_CARE_PROVIDER_SITE_OTHER): Payer: BLUE CROSS/BLUE SHIELD | Admitting: Podiatry

## 2018-08-07 DIAGNOSIS — M722 Plantar fascial fibromatosis: Secondary | ICD-10-CM | POA: Diagnosis not present

## 2018-08-09 ENCOUNTER — Telehealth: Payer: Self-pay | Admitting: Podiatry

## 2018-08-09 NOTE — Telephone Encounter (Signed)
I told pt Dr. Paulla Dolly recommended ice therapy 3-4 times daily for 15-20 minutes protecting the skin from the ice with a light cloth. Pt states [place] is at the beach and has not unpacked her naproxen, but she will take it and ice. I transferred pt to scheduler for an appt.

## 2018-08-09 NOTE — Telephone Encounter (Signed)
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 Please give patient a call.

## 2018-08-12 ENCOUNTER — Ambulatory Visit: Payer: BLUE CROSS/BLUE SHIELD

## 2018-08-12 ENCOUNTER — Encounter: Payer: BLUE CROSS/BLUE SHIELD | Admitting: Podiatry

## 2018-08-12 NOTE — Progress Notes (Signed)
This encounter was created in error - please disregard.

## 2018-08-12 NOTE — Progress Notes (Signed)
Virtual Visit via Video Note  I connected with Brandy Stone on 08/12/18 at 12:15 PM EDT by a video enabled telemedicine application and verified that I am speaking with the correct person using two identifiers.  Location: Patient: home Provider: office   I discussed the limitations of evaluation and management by telemedicine and the availability of in person appointments. The patient expressed understanding and agreed to proceed.  History of Present Illness: Patient presents stating that she has had tremendous discomfort in her arch and that she is been on her foot a lot with work and is currently traveling.  She is quite nervous at the current time and is concerned about the pain she is experiencing    Observations/Objective the pain seems to be in the mid arch region bilateral and has intensified:   Assessment and Plan: Appears to be a fasciitis-like condition that is acute in nature and at this point I did go ahead and get a start her on Aleve 2 pills twice a day and I gave her instructions for physical therapy shoe gear modification and to be seen in the office   Follow Up Instructions:    I discussed the assessment and treatment plan with the patient. The patient was provided an opportunity to ask questions and all were answered. The patient agreed with the plan and demonstrated an understanding of the instructions.   The patient was advised to call back or seek an in-person evaluation if the symptoms worsen or if the condition fails to improve as anticipated.  I provided 20 minutes of non-face-to-face time during this encounter.   Wallene Huh, DPM

## 2018-08-16 DIAGNOSIS — F4323 Adjustment disorder with mixed anxiety and depressed mood: Secondary | ICD-10-CM | POA: Diagnosis not present

## 2018-08-19 DIAGNOSIS — F332 Major depressive disorder, recurrent severe without psychotic features: Secondary | ICD-10-CM | POA: Diagnosis not present

## 2018-08-20 DIAGNOSIS — F4323 Adjustment disorder with mixed anxiety and depressed mood: Secondary | ICD-10-CM | POA: Diagnosis not present

## 2018-08-21 DIAGNOSIS — M9903 Segmental and somatic dysfunction of lumbar region: Secondary | ICD-10-CM | POA: Diagnosis not present

## 2018-08-21 DIAGNOSIS — M9901 Segmental and somatic dysfunction of cervical region: Secondary | ICD-10-CM | POA: Diagnosis not present

## 2018-08-23 DIAGNOSIS — M9901 Segmental and somatic dysfunction of cervical region: Secondary | ICD-10-CM | POA: Diagnosis not present

## 2018-08-23 DIAGNOSIS — M9903 Segmental and somatic dysfunction of lumbar region: Secondary | ICD-10-CM | POA: Diagnosis not present

## 2018-08-26 DIAGNOSIS — M533 Sacrococcygeal disorders, not elsewhere classified: Secondary | ICD-10-CM | POA: Diagnosis not present

## 2018-08-27 DIAGNOSIS — M9903 Segmental and somatic dysfunction of lumbar region: Secondary | ICD-10-CM | POA: Diagnosis not present

## 2018-08-27 DIAGNOSIS — M9901 Segmental and somatic dysfunction of cervical region: Secondary | ICD-10-CM | POA: Diagnosis not present

## 2018-08-28 DIAGNOSIS — M533 Sacrococcygeal disorders, not elsewhere classified: Secondary | ICD-10-CM | POA: Diagnosis not present

## 2018-09-06 DIAGNOSIS — F4323 Adjustment disorder with mixed anxiety and depressed mood: Secondary | ICD-10-CM | POA: Diagnosis not present

## 2018-09-10 DIAGNOSIS — K5900 Constipation, unspecified: Secondary | ICD-10-CM | POA: Diagnosis not present

## 2018-09-10 DIAGNOSIS — N959 Unspecified menopausal and perimenopausal disorder: Secondary | ICD-10-CM | POA: Diagnosis not present

## 2018-09-10 DIAGNOSIS — F329 Major depressive disorder, single episode, unspecified: Secondary | ICD-10-CM | POA: Diagnosis not present

## 2018-09-10 DIAGNOSIS — D259 Leiomyoma of uterus, unspecified: Secondary | ICD-10-CM | POA: Diagnosis not present

## 2018-09-11 DIAGNOSIS — M9901 Segmental and somatic dysfunction of cervical region: Secondary | ICD-10-CM | POA: Diagnosis not present

## 2018-09-11 DIAGNOSIS — M9903 Segmental and somatic dysfunction of lumbar region: Secondary | ICD-10-CM | POA: Diagnosis not present

## 2018-09-13 DIAGNOSIS — M9903 Segmental and somatic dysfunction of lumbar region: Secondary | ICD-10-CM | POA: Diagnosis not present

## 2018-09-13 DIAGNOSIS — M9901 Segmental and somatic dysfunction of cervical region: Secondary | ICD-10-CM | POA: Diagnosis not present

## 2018-09-23 DIAGNOSIS — I8392 Asymptomatic varicose veins of left lower extremity: Secondary | ICD-10-CM | POA: Diagnosis not present

## 2018-09-23 DIAGNOSIS — N951 Menopausal and female climacteric states: Secondary | ICD-10-CM | POA: Insufficient documentation

## 2018-09-23 DIAGNOSIS — M797 Fibromyalgia: Secondary | ICD-10-CM | POA: Insufficient documentation

## 2018-09-23 DIAGNOSIS — Z1231 Encounter for screening mammogram for malignant neoplasm of breast: Secondary | ICD-10-CM | POA: Diagnosis not present

## 2018-09-23 DIAGNOSIS — Z6826 Body mass index (BMI) 26.0-26.9, adult: Secondary | ICD-10-CM | POA: Diagnosis not present

## 2018-09-23 DIAGNOSIS — Z01419 Encounter for gynecological examination (general) (routine) without abnormal findings: Secondary | ICD-10-CM | POA: Diagnosis not present

## 2018-09-23 DIAGNOSIS — N959 Unspecified menopausal and perimenopausal disorder: Secondary | ICD-10-CM | POA: Diagnosis not present

## 2018-09-24 ENCOUNTER — Ambulatory Visit: Payer: BC Managed Care – PPO | Admitting: *Deleted

## 2018-09-24 ENCOUNTER — Telehealth: Payer: Self-pay | Admitting: *Deleted

## 2018-09-24 ENCOUNTER — Other Ambulatory Visit: Payer: Self-pay

## 2018-09-24 VITALS — Ht 68.0 in | Wt 173.0 lb

## 2018-09-24 DIAGNOSIS — Z1211 Encounter for screening for malignant neoplasm of colon: Secondary | ICD-10-CM

## 2018-09-24 MED ORDER — NA SULFATE-K SULFATE-MG SULF 17.5-3.13-1.6 GM/177ML PO SOLN: ORAL | 0 refills | Status: DC

## 2018-09-24 NOTE — Progress Notes (Signed)
Patient's pre-visit was done today over the phone with the patient AND pt's mom (per pt's request) due to COVID-19 pandemic. Name,DOB and address verified. Insurance verified. Packet of Prep instructions mailed to patient including copy of a consent form and pre-procedure patient acknowledgement form-pt is aware. Suprep PNM Coupon included. Patient understands to call us back with any questions or concerns. 2day prep due to Constipation per pt.  Patient denies any allergies to eggs or soy. Patient denies any problems with anesthesia/sedation. Patient denies any oxygen use at home. Patient denies taking any diet/weight loss medications or blood thinners. EMMI education assisgned to patient on colonoscopy, this was explained and instructions given to patient. Pt is aware that care partner will wait in the car during parking lot; if they feel like they will be too hot to wait in the car; they may wait in the lobby.  We want them to wear a mask (we do not have any that we can provide them), practice social distancing, and we will check their temperatures when they get here.  I did remind patient that their care partner needs to stay in the parking lot the entire time. Pt will wear mask into building

## 2018-09-24 NOTE — Telephone Encounter (Signed)
Patient was called at 3 pm, pre-visit done via phone.

## 2018-09-24 NOTE — Telephone Encounter (Signed)
Patient was called at 1003 and 1012 am, no answer, "mail box is full" unable to leave a message. Will try again later today.

## 2018-09-25 ENCOUNTER — Encounter: Payer: Self-pay | Admitting: Gastroenterology

## 2018-10-01 DIAGNOSIS — L814 Other melanin hyperpigmentation: Secondary | ICD-10-CM | POA: Diagnosis not present

## 2018-10-01 DIAGNOSIS — D225 Melanocytic nevi of trunk: Secondary | ICD-10-CM | POA: Diagnosis not present

## 2018-10-04 DIAGNOSIS — F4323 Adjustment disorder with mixed anxiety and depressed mood: Secondary | ICD-10-CM | POA: Diagnosis not present

## 2018-10-04 DIAGNOSIS — M9903 Segmental and somatic dysfunction of lumbar region: Secondary | ICD-10-CM | POA: Diagnosis not present

## 2018-10-04 DIAGNOSIS — M9901 Segmental and somatic dysfunction of cervical region: Secondary | ICD-10-CM | POA: Diagnosis not present

## 2018-10-15 ENCOUNTER — Telehealth: Payer: Self-pay | Admitting: Gastroenterology

## 2018-10-15 NOTE — Telephone Encounter (Signed)
NO answer and Vmail is full.  Unable to leave message Covid-19 Screening Questions:  Do you now or have you had a fever in the last 14 days?   Do you have any respiratory symptoms of shortness of breath or cough now or in the last 14 days?   Do you have any family members or close contacts with diagnosed or suspected Covid-19 in the past 14 days?   Have you been tested for Covid-19 and found to be positive?

## 2018-10-16 ENCOUNTER — Other Ambulatory Visit: Payer: Self-pay

## 2018-10-16 ENCOUNTER — Encounter: Payer: Self-pay | Admitting: Gastroenterology

## 2018-10-16 ENCOUNTER — Ambulatory Visit (AMBULATORY_SURGERY_CENTER): Payer: BC Managed Care – PPO | Admitting: Gastroenterology

## 2018-10-16 VITALS — BP 114/73 | HR 65 | Temp 99.0°F | Resp 16 | Ht 68.0 in | Wt 179.0 lb

## 2018-10-16 DIAGNOSIS — K635 Polyp of colon: Secondary | ICD-10-CM

## 2018-10-16 DIAGNOSIS — D125 Benign neoplasm of sigmoid colon: Secondary | ICD-10-CM

## 2018-10-16 DIAGNOSIS — Z1211 Encounter for screening for malignant neoplasm of colon: Secondary | ICD-10-CM | POA: Diagnosis not present

## 2018-10-16 DIAGNOSIS — D123 Benign neoplasm of transverse colon: Secondary | ICD-10-CM

## 2018-10-16 MED ORDER — SODIUM CHLORIDE 0.9 % IV SOLN: 500.0000 mL | Freq: Once | INTRAVENOUS | Status: DC

## 2018-10-16 NOTE — Progress Notes (Signed)
A/ox3, pleased with MAC, report to RN 

## 2018-10-16 NOTE — Op Note (Signed)
[facility] Patient Name: Brandy Stone Procedure Date: 10/16/2018 12:51 PM MRN: [id number] Endoscopist: Mauri Pole , MD Age: 53 Referring MD:  Date of Birth: [birth year] Gender: Female Account #: [id number] Procedure:                Colonoscopy Indications:              Screening for colorectal malignant neoplasm Medicines:                Monitored Anesthesia Care Procedure:                Pre-Anesthesia Assessment:                           - Prior to the procedure, a History and Physical                            was performed, and patient medications and                            allergies were reviewed. The patient's tolerance of                            previous anesthesia was also reviewed. The risks                            and benefits of the procedure and the sedation                            options and risks were discussed with the patient.                            All questions were answered, and informed consent                            was obtained. Prior Anticoagulants: The patient has                            taken no previous anticoagulant or antiplatelet                            agents. ASA Grade Assessment: II - A patient with                            mild systemic disease. After reviewing the risks                            and benefits, the patient was deemed in                            satisfactory condition to undergo the procedure.                           After obtaining informed consent, the colonoscope  was passed under direct vision. Throughout the                            procedure, the patient's blood pressure, pulse, and                            oxygen saturations were monitored continuously. The                            Model PCF-[id number]) scope was introduced                            through the anus and advanced to the the cecum,                            identified  by appendiceal orifice and ileocecal                            valve. The colonoscopy was performed without                            difficulty. The patient tolerated the procedure                            well. The quality of the bowel preparation was                            excellent. The ileocecal valve, appendiceal                            orifice, and rectum were photographed. Scope In: 1:38:15 PM Scope Out: 1:53:52 PM Scope Withdrawal Time: 0 hours 10 minutes 25 seconds  Total Procedure Duration: 0 hours 15 minutes 37 seconds  Findings:                 The perianal and digital rectal examinations were                            normal.                           Two sessile polyps were found in the sigmoid colon                            and transverse colon. The polyps were 5 to 7 mm in                            size. These polyps were removed with a cold snare.                            Resection and retrieval were complete.                           Non-bleeding internal hemorrhoids were found during  retroflexion. The hemorrhoids were small. Complications:            No immediate complications. Estimated Blood Loss:     Estimated blood loss was minimal. Impression:               - Two 5 to 7 mm polyps in the sigmoid colon and in                            the transverse colon, removed with a cold snare.                            Resected and retrieved.                           - Non-bleeding internal hemorrhoids. Recommendation:           - Patient has a contact number available for                            emergencies. The signs and symptoms of potential                            delayed complications were discussed with the                            patient. Return to normal activities tomorrow.                            Written discharge instructions were provided to the                            patient.                            - Resume previous diet.                           - Continue present medications.                           - Await pathology results.                           - Repeat colonoscopy in 5 years for surveillance of                            multiple polyps. Mauri Pole, MD 10/16/2018 2:00:17 PM This report has been signed electronically.

## 2018-10-16 NOTE — Patient Instructions (Signed)
YOU HAD AN ENDOSCOPIC PROCEDURE TODAY AT [facility]:   Refer to the procedure report that was given to you for any specific questions about what was found during the examination.  If the procedure report does not answer your questions, please call your gastroenterologist to clarify.  If you requested that your care partner not be given the details of your procedure findings, then the procedure report has been included in a sealed envelope for you to review at your convenience later.  YOU SHOULD EXPECT: Some feelings of bloating in the abdomen. Passage of more gas than usual.  Walking can help get rid of the air that was put into your GI tract during the procedure and reduce the bloating. If you had a lower endoscopy (such as a colonoscopy or flexible sigmoidoscopy) you may notice spotting of blood in your stool or on the toilet paper. If you underwent a bowel prep for your procedure, you may not have a normal bowel movement for a few days.  Please Note:  You might notice some irritation and congestion in your nose or some drainage.  This is from the oxygen used during your procedure.  There is no need for concern and it should clear up in a day or so.  SYMPTOMS TO REPORT IMMEDIATELY:   Following lower endoscopy (colonoscopy or flexible sigmoidoscopy):  Excessive amounts of blood in the stool  Significant tenderness or worsening of abdominal pains  Swelling of the abdomen that is new, acute  Fever of 100F or higher  For urgent or emergent issues, a gastroenterologist can be reached at any hour by calling [phone].  DIET:  We do recommend a small meal at first, but then you may proceed to your regular diet.  Drink plenty of fluids but you should avoid alcoholic beverages for 24 hours.  ACTIVITY:  You should plan to take it easy for the rest of today and you should NOT DRIVE or use heavy machinery until tomorrow (because of the sedation medicines used during the test).     FOLLOW UP: Our staff will call the number listed on your records 48-72 hours following your procedure to check on you and address any questions or concerns that you may have regarding the information given to you following your procedure. If we do not reach you, we will leave a message.  We will attempt to reach you two times.  During this call, we will ask if you have developed any symptoms of COVID 19. If you develop any symptoms (ie: fever, flu-like symptoms, shortness of breath, cough etc.) before then, please call [phone].  If you test positive for Covid 19 in the 2 weeks post procedure, please call and report this information to [place].    If any biopsies were taken you will be contacted by phone or by letter within the next 1-3 weeks.  Please call us at [phone] if you have not heard about the biopsies in 3 weeks.    SIGNATURES/CONFIDENTIALITY: You and/or your care partner have signed paperwork which will be entered into your electronic medical record.  These signatures attest to the fact that that the information above on your After Visit Summary has been reviewed and is understood.  Full responsibility of the confidentiality of this discharge information lies with you and/or your care-partner.  Await pathology  Please read over handouts about polyps and hemorrhoids  Next colonoscopy- 5 years  Please continue your normal medications

## 2018-10-16 NOTE — Progress Notes (Signed)
Called to room to assist during endoscopic procedure.  Patient ID and intended procedure confirmed with present staff. Received instructions for my participation in the procedure from the performing physician.  

## 2018-10-16 NOTE — Progress Notes (Signed)
Pt's states no medical or surgical changes since previsit or office visit. 

## 2018-10-18 ENCOUNTER — Telehealth: Payer: Self-pay | Admitting: *Deleted

## 2018-10-18 ENCOUNTER — Telehealth: Payer: Self-pay

## 2018-10-18 NOTE — Telephone Encounter (Signed)
  Follow up Call-  Call back number 10/16/2018  Post procedure Call Back phone  # [phone]  Some recent data might be hidden     Patient questions:  Do you have a fever, pain , or abdominal swelling? No. Pain Score  0   Have you tolerated food without any problems? Yes.    Have you been able to return to your normal activities? Yes.    Do you have any questions about your discharge instructions: Diet   No. Medications  No. Follow up visit  No.  Do you have questions or concerns about your Care? Yes.  Pt feels that she has a "balloon" in her stomach.  However she's passing gas and eating.  No pain stated and patient suggested it could be her anxiety.  I advised her to continue to walk around her house and pass air and continue to avoid foods that will cause her to have more gas.  Also told her to call back if she continues to have problems.    Actions: * If pain score is 4 or above: No action needed, pain <4.  1. Have you developed a fever since your procedure? NO  2.   Have you had an respiratory symptoms (SOB or cough) since your procedure? NO  3.   Have you tested positive for COVID 19 since your procedure NO  4.   Have you had any family members/close contacts diagnosed with the COVID 19 since your procedure?  NO   If yes to any of these questions please route to Joylene John, RN and Alphonsa Gin, RN.

## 2018-10-23 DIAGNOSIS — M542 Cervicalgia: Secondary | ICD-10-CM | POA: Diagnosis not present

## 2018-10-23 DIAGNOSIS — M79641 Pain in right hand: Secondary | ICD-10-CM | POA: Diagnosis not present

## 2018-10-23 DIAGNOSIS — M533 Sacrococcygeal disorders, not elsewhere classified: Secondary | ICD-10-CM | POA: Diagnosis not present

## 2018-10-23 DIAGNOSIS — M79671 Pain in right foot: Secondary | ICD-10-CM | POA: Diagnosis not present

## 2018-10-24 NOTE — Telephone Encounter (Signed)
Left message

## 2018-10-25 ENCOUNTER — Other Ambulatory Visit: Payer: Self-pay | Admitting: Pain Medicine

## 2018-10-25 ENCOUNTER — Other Ambulatory Visit: Payer: Self-pay

## 2018-10-25 DIAGNOSIS — M542 Cervicalgia: Secondary | ICD-10-CM

## 2018-10-25 DIAGNOSIS — I83893 Varicose veins of bilateral lower extremities with other complications: Secondary | ICD-10-CM

## 2018-10-28 DIAGNOSIS — N950 Postmenopausal bleeding: Secondary | ICD-10-CM | POA: Diagnosis not present

## 2018-10-28 DIAGNOSIS — Z809 Family history of malignant neoplasm, unspecified: Secondary | ICD-10-CM | POA: Diagnosis not present

## 2018-10-28 DIAGNOSIS — K625 Hemorrhage of anus and rectum: Secondary | ICD-10-CM | POA: Diagnosis not present

## 2018-10-29 ENCOUNTER — Encounter: Payer: Self-pay | Admitting: Gastroenterology

## 2018-10-30 ENCOUNTER — Telehealth (HOSPITAL_COMMUNITY): Payer: Self-pay

## 2018-10-30 NOTE — Telephone Encounter (Signed)
   Attempted to contact patient and voicemail box was full.

## 2018-10-31 ENCOUNTER — Other Ambulatory Visit: Payer: Self-pay

## 2018-10-31 ENCOUNTER — Ambulatory Visit (INDEPENDENT_AMBULATORY_CARE_PROVIDER_SITE_OTHER): Payer: BC Managed Care – PPO | Admitting: Vascular Surgery

## 2018-10-31 ENCOUNTER — Encounter: Payer: Self-pay | Admitting: Vascular Surgery

## 2018-10-31 ENCOUNTER — Ambulatory Visit (HOSPITAL_COMMUNITY)
Admission: RE | Admit: 2018-10-31 | Discharge: 2018-10-31 | Disposition: A | Discharge status: Home / Self Care | Payer: BC Managed Care – PPO | Source: Ambulatory Visit | Attending: Family | Admitting: Family

## 2018-10-31 VITALS — BP 133/87 | HR 84 | Temp 97.1°F | Resp 18 | Ht 67.0 in | Wt 183.1 lb

## 2018-10-31 DIAGNOSIS — I83812 Varicose veins of left lower extremities with pain: Secondary | ICD-10-CM | POA: Diagnosis not present

## 2018-10-31 DIAGNOSIS — I83893 Varicose veins of bilateral lower extremities with other complications: Secondary | ICD-10-CM | POA: Diagnosis not present

## 2018-10-31 NOTE — Progress Notes (Signed)
REASON FOR CONSULT:    Painful varicose veins left lower extremity.  ASSESSMENT & PLAN:   TELANGIECTASIAS AND RETICULAR VEINS LEFT LEG: This patient has CEAP C1 venous disease.  She has reticular veins and telangiectasias in her medial left calf but no large dilated truncal varicosities.  She has no evidence of DVT and no deep venous reflux.  She only has a short segment of superficial venous reflux in her left greater saphenous vein at the knee but the vein is not especially dilated.  We have discussed conservative treatment including the importance of intermittent leg elevation and the proper positioning for this.  I have written her a prescription for knee-high compression stockings with a gradient of 15 to 20 mmHg.  I have encouraged her to avoid prolonged sitting and standing.  We discussed the importance of exercise specifically walking and water aerobics.  I explained the venous disease does tend to be progressive.  I think she would be a reasonable candidate for sclerotherapy of the telangiectasias and reticular veins in her medial left calf and if she decides to proceed with this she will call Penni Homans RN, our sclerotherapy nurse.  Deitra Mayo, MD, FACS Beeper [phone] Office: [phone]   HPI:   Brandy Stone is a pleasant 53 y.o. female, who presents for evaluation of varicose veins of the left leg.  She is had some small varicose veins in her medial left calf for many years.  These are really asymptomatic.  She denies any significant aching pain or heaviness in her legs.  She denies any swelling.  She is had no previous history of DVT or phlebitis.  She does have some chronic pain issues related to fibromyalgia.  She has no real risk factors for peripheral vascular disease.  Specifically, she denies diabetes, hypertension, hypercholesterolemia, family history of premature cardiac vascular disease, or smoking history.  Past Medical History:  Diagnosis Date  . Anemia    . Asthma   . Fibromyalgia   . Herpes simplex virus (HSV) infection   . HPV (human papilloma virus) infection   . Hx of cervical cancer   . Infertility, female   . PTSD (post-traumatic stress disorder)   . Trauma   . Vaginal Pap smear, abnormal     Family History  Problem Relation Age of Onset  . Endometriosis Mother   . Hypertension Mother   . Hyperlipidemia Mother   . Colon polyps Mother   . Hyperlipidemia Father   . Lung cancer Father   . Melanoma Father   . Colon polyps Father   . Colon polyps Brother   . Esophageal cancer Paternal Grandfather   . Colon cancer Neg Hx   . Pancreatic cancer Neg Hx   . Stomach cancer Neg Hx   . Liver disease Neg Hx   . Rectal cancer Neg Hx     SOCIAL HISTORY: Social History   Socioeconomic History  . Marital status: Divorced    Spouse name: Not on file  . Number of children: Not on file  . Years of education: Not on file  . Highest education level: Not on file  Occupational History  . Not on file  Social Needs  . Financial resource strain: Not on file  . Food insecurity    Worry: Not on file    Inability: Not on file  . Transportation needs    Medical: Not on file    Non-medical: Not on file  Tobacco Use  . Smoking status:  Current Some Day Smoker    Types: Cigarettes  . Smokeless tobacco: Never Used  Substance and Sexual Activity  . Alcohol use: Yes    Comment: social  . Drug use: No  . Sexual activity: Yes    Birth control/protection: None  Lifestyle  . Physical activity    Days per week: Not on file    Minutes per session: Not on file  . Stress: Not on file  Relationships  . Social Herbalist on phone: Not on file    Gets together: Not on file    Attends religious service: Not on file    Active member of club or organization: Not on file    Attends meetings of clubs or organizations: Not on file    Relationship status: Not on file  . Intimate partner violence    Fear of current or ex partner: Not  on file    Emotionally abused: Not on file    Physically abused: Not on file    Forced sexual activity: Not on file  Other Topics Concern  . Not on file  Social History Narrative  . Not on file    Allergies  Allergen Reactions  . Food Anaphylaxis    Multiple foods trigger patients asthma ,some lead to anaphylactic shock   . Latex Rash and Other (See Comments)    redness  . Sulfa Antibiotics Rash    Current Outpatient Medications  Medication Sig Dispense Refill  . albuterol (PROVENTIL HFA;VENTOLIN HFA) 108 (90 BASE) MCG/ACT inhaler Inhale 2 puffs into the lungs every 6 (six) hours as needed. For shortness of breath    . Cholecalciferol (VITAMIN D3) 2000 units TABS Take 1 tablet by mouth daily.    . COMBIPATCH 0.05-0.25 MG/DAY     . cyclobenzaprine (FLEXERIL) 10 MG tablet cyclobenzaprine 10 mg tablet    . DULoxetine (CYMBALTA) 30 MG capsule     . LORazepam (ATIVAN) 0.5 MG tablet Take 0.5 mg by mouth as needed.   1  . Multiple Vitamin (MULTIVITAMIN) tablet Take 1 tablet by mouth daily.     No current facility-administered medications for this visit.     REVIEW OF SYSTEMS:  [X]  denotes positive finding, [ ]  denotes negative finding Cardiac  Comments:  Chest pain or chest pressure:    Shortness of breath upon exertion:    Short of breath when lying flat:    Irregular heart rhythm:        Vascular    Pain in calf, thigh, or hip brought on by ambulation:    Pain in feet at night that wakes you up from your sleep:     Blood clot in your veins:    Leg swelling:         Pulmonary    Oxygen at home:    Productive cough:     Wheezing:         Neurologic    Sudden weakness in arms or legs:     Sudden numbness in arms or legs:     Sudden onset of difficulty speaking or slurred speech:    Temporary loss of vision in one eye:     Problems with dizziness:         Gastrointestinal    Blood in stool:     Vomited blood:         Genitourinary    Burning when urinating:      Blood in urine:  Psychiatric    Major depression:         Hematologic    Bleeding problems:    Problems with blood clotting too easily:        Skin    Rashes or ulcers:        Constitutional    Fever or chills:     PHYSICAL EXAM:   Vitals:   10/31/18 1101  BP: 133/87  Pulse: 84  Resp: 18  Temp: (!) 97.1 F (36.2 C)  TempSrc: Temporal  SpO2: 100%  Weight: 183 lb 1.6 oz (83.1 kg)  Height: 5\' 7"  (1.702 m)    GENERAL: The patient is a well-nourished female, in no acute distress. The vital signs are documented above. CARDIAC: There is a regular rate and rhythm.  VASCULAR: I do not detect carotid bruits. She has palpable dorsalis pedis and posterior tibial pulses bilaterally. She has some telangiectasias and reticular veins in her medial left calf.  She has no large truncal varicosities.  She does not have significant leg swelling or hyperpigmentation.   PULMONARY: There is good air exchange bilaterally without wheezing or rales. ABDOMEN: Soft and non-tender with normal pitched bowel sounds.  MUSCULOSKELETAL: There are no major deformities or cyanosis. NEUROLOGIC: No focal weakness or paresthesias are detected. SKIN: There are no ulcers or rashes noted. PSYCHIATRIC: The patient has a normal affect.  DATA:    VENOUS DUPLEX: I have independently interpreted the venous duplex of the left lower extremity today.  There is no evidence of deep venous thrombosis on the left.  There is no evidence of superficial venous thrombosis.  There is no significant deep venous reflux.  There is superficial venous reflux involving the great saphenous vein at the knee and proximal calf.  There is also superficial venous reflux at the origin of the small saphenous vein.

## 2018-11-05 ENCOUNTER — Other Ambulatory Visit: Payer: BC Managed Care – PPO

## 2018-11-05 DIAGNOSIS — R200 Anesthesia of skin: Secondary | ICD-10-CM | POA: Diagnosis not present

## 2018-11-12 ENCOUNTER — Ambulatory Visit
Admission: RE | Admit: 2018-11-12 | Discharge: 2018-11-12 | Disposition: A | Discharge status: Home / Self Care | Payer: BC Managed Care – PPO | Source: Ambulatory Visit | Attending: Pain Medicine | Admitting: Pain Medicine

## 2018-11-12 DIAGNOSIS — M542 Cervicalgia: Secondary | ICD-10-CM

## 2019-01-21 DIAGNOSIS — M9901 Segmental and somatic dysfunction of cervical region: Secondary | ICD-10-CM | POA: Diagnosis not present

## 2019-01-21 DIAGNOSIS — M9903 Segmental and somatic dysfunction of lumbar region: Secondary | ICD-10-CM | POA: Diagnosis not present

## 2019-01-23 DIAGNOSIS — F3342 Major depressive disorder, recurrent, in full remission: Secondary | ICD-10-CM | POA: Diagnosis not present

## 2019-01-27 DIAGNOSIS — F4323 Adjustment disorder with mixed anxiety and depressed mood: Secondary | ICD-10-CM | POA: Diagnosis not present

## 2019-02-18 DIAGNOSIS — M9903 Segmental and somatic dysfunction of lumbar region: Secondary | ICD-10-CM | POA: Diagnosis not present

## 2019-02-18 DIAGNOSIS — M9901 Segmental and somatic dysfunction of cervical region: Secondary | ICD-10-CM | POA: Diagnosis not present

## 2019-02-20 DIAGNOSIS — M9901 Segmental and somatic dysfunction of cervical region: Secondary | ICD-10-CM | POA: Diagnosis not present

## 2019-02-20 DIAGNOSIS — M9903 Segmental and somatic dysfunction of lumbar region: Secondary | ICD-10-CM | POA: Diagnosis not present

## 2019-03-20 DIAGNOSIS — M9901 Segmental and somatic dysfunction of cervical region: Secondary | ICD-10-CM | POA: Diagnosis not present

## 2019-03-20 DIAGNOSIS — M9903 Segmental and somatic dysfunction of lumbar region: Secondary | ICD-10-CM | POA: Diagnosis not present

## 2019-07-17 DIAGNOSIS — F411 Generalized anxiety disorder: Secondary | ICD-10-CM | POA: Diagnosis not present

## 2019-07-18 ENCOUNTER — Ambulatory Visit: Payer: BC Managed Care – PPO | Attending: Internal Medicine

## 2019-07-18 DIAGNOSIS — Z23 Encounter for immunization: Secondary | ICD-10-CM

## 2019-07-18 NOTE — Progress Notes (Signed)
   Covid-[facility] Vaccination [facility]  Name:  Brandy Stone    MRN: [id number] DOB: Jan 28, 1966  07/18/2019  [name] was observed post Covid-19 immunization for 15 minutes without incident. She was provided with Vaccine Information Sheet and instruction to access the V-Safe system.   [name] was instructed to call 911 with any severe reactions post vaccine: Marland Kitchen Difficulty breathing  . Swelling of face and throat  . A fast heartbeat  . A bad rash all over body  . Dizziness and weakness   Immunizations Administered    Name Date Dose VIS Date Route   Pfizer COVID-19 Vaccine 07/18/2019  3:26 PM 0.3 mL 03/21/2019 Intramuscular   Manufacturer: [place]   Lot: [id number]   Gu-Win: [id number]

## 2019-07-29 DIAGNOSIS — M9903 Segmental and somatic dysfunction of lumbar region: Secondary | ICD-10-CM | POA: Diagnosis not present

## 2019-07-29 DIAGNOSIS — M9901 Segmental and somatic dysfunction of cervical region: Secondary | ICD-10-CM | POA: Diagnosis not present

## 2019-07-31 DIAGNOSIS — F4322 Adjustment disorder with anxiety: Secondary | ICD-10-CM | POA: Diagnosis not present

## 2019-08-01 DIAGNOSIS — F4323 Adjustment disorder with mixed anxiety and depressed mood: Secondary | ICD-10-CM | POA: Diagnosis not present

## 2019-08-11 ENCOUNTER — Ambulatory Visit: Payer: BC Managed Care – PPO

## 2019-08-12 ENCOUNTER — Ambulatory Visit: Payer: BC Managed Care – PPO

## 2019-08-19 DIAGNOSIS — F4322 Adjustment disorder with anxiety: Secondary | ICD-10-CM | POA: Diagnosis not present

## 2019-08-19 DIAGNOSIS — F3189 Other bipolar disorder: Secondary | ICD-10-CM | POA: Diagnosis not present

## 2019-08-20 ENCOUNTER — Ambulatory Visit: Payer: BLUE CROSS/BLUE SHIELD | Admitting: Podiatry

## 2019-08-21 DIAGNOSIS — F4323 Adjustment disorder with mixed anxiety and depressed mood: Secondary | ICD-10-CM | POA: Diagnosis not present

## 2019-09-10 DIAGNOSIS — R609 Edema, unspecified: Secondary | ICD-10-CM | POA: Diagnosis not present

## 2019-09-10 DIAGNOSIS — N959 Unspecified menopausal and perimenopausal disorder: Secondary | ICD-10-CM | POA: Diagnosis not present

## 2019-09-10 DIAGNOSIS — M797 Fibromyalgia: Secondary | ICD-10-CM | POA: Diagnosis not present

## 2019-09-17 DIAGNOSIS — F4323 Adjustment disorder with mixed anxiety and depressed mood: Secondary | ICD-10-CM | POA: Diagnosis not present

## 2019-10-14 DIAGNOSIS — F4322 Adjustment disorder with anxiety: Secondary | ICD-10-CM | POA: Diagnosis not present

## 2019-10-16 DIAGNOSIS — Z1231 Encounter for screening mammogram for malignant neoplasm of breast: Secondary | ICD-10-CM | POA: Diagnosis not present

## 2019-10-16 DIAGNOSIS — Z01419 Encounter for gynecological examination (general) (routine) without abnormal findings: Secondary | ICD-10-CM | POA: Diagnosis not present

## 2019-10-16 DIAGNOSIS — Z6828 Body mass index (BMI) 28.0-28.9, adult: Secondary | ICD-10-CM | POA: Diagnosis not present

## 2019-11-06 DIAGNOSIS — F4322 Adjustment disorder with anxiety: Secondary | ICD-10-CM | POA: Diagnosis not present

## 2019-12-03 DIAGNOSIS — D2262 Melanocytic nevi of left upper limb, including shoulder: Secondary | ICD-10-CM | POA: Diagnosis not present

## 2019-12-03 DIAGNOSIS — F424 Excoriation (skin-picking) disorder: Secondary | ICD-10-CM | POA: Diagnosis not present

## 2019-12-03 DIAGNOSIS — D2271 Melanocytic nevi of right lower limb, including hip: Secondary | ICD-10-CM | POA: Diagnosis not present

## 2019-12-17 DIAGNOSIS — F4323 Adjustment disorder with mixed anxiety and depressed mood: Secondary | ICD-10-CM | POA: Diagnosis not present

## 2020-02-10 DIAGNOSIS — F3189 Other bipolar disorder: Secondary | ICD-10-CM | POA: Diagnosis not present

## 2020-02-10 DIAGNOSIS — G4700 Insomnia, unspecified: Secondary | ICD-10-CM | POA: Diagnosis not present

## 2020-02-10 DIAGNOSIS — F4322 Adjustment disorder with anxiety: Secondary | ICD-10-CM | POA: Diagnosis not present

## 2020-02-17 DIAGNOSIS — F4322 Adjustment disorder with anxiety: Secondary | ICD-10-CM | POA: Diagnosis not present

## 2020-02-29 IMAGING — CT CT CERVICAL SPINE WITHOUT CONTRAST
3 of 5 series · 11 of 33 positions shown, 13 images · non-contrast
Comparison: None.

CLINICAL DATA: Neck pain, left, bilateral hand pain and numbness

EXAM:
CT CERVICAL SPINE WITHOUT CONTRAST
TECHNIQUE: Multidetector CT imaging of the cervical spine was performed without
intravenous contrast. Multiplanar CT image reconstructions were also
generated.

[Series 3: c-spine 2.00 br60 s3 axial bone · axial · 0.23mm/px · z∈[-705,-613]mm · 3 of 94 slices shown, 4 images]
[im 24/94  soft-tissue]
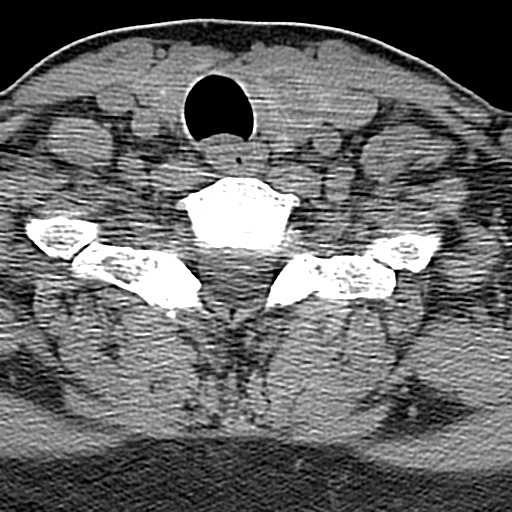
[im 24/94  bone]
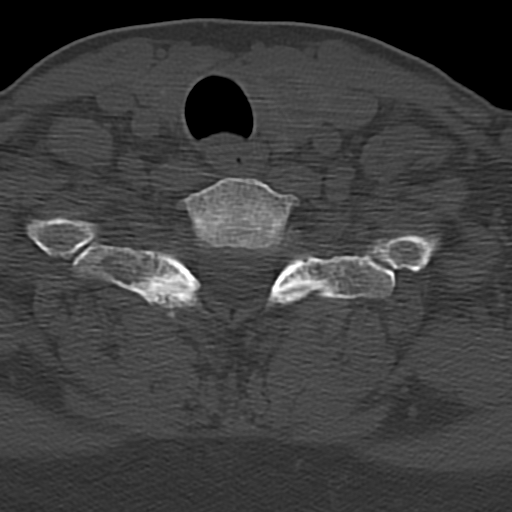
[im 47/94  bone]
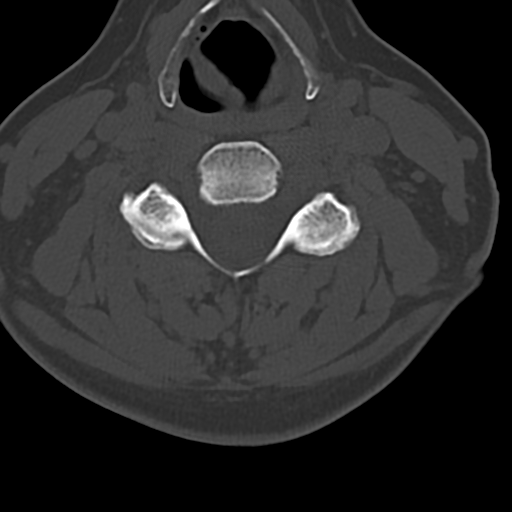
[im 70/94  bone]
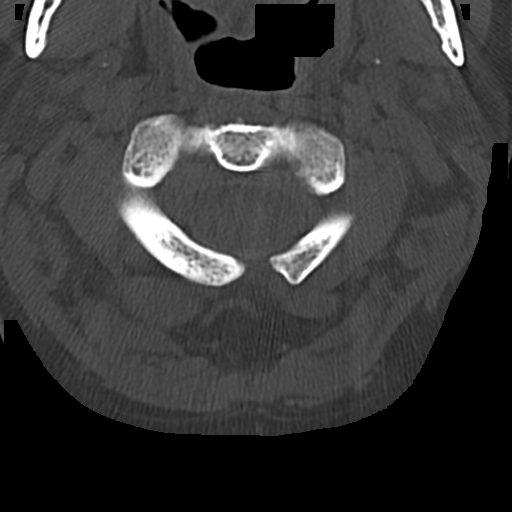

[Series 5: c-spine 2.00 br60 s3 sag bone · sagittal · 0.23mm/px · 5 of 60 slices shown, 6 images]
[im 20/60  bone]
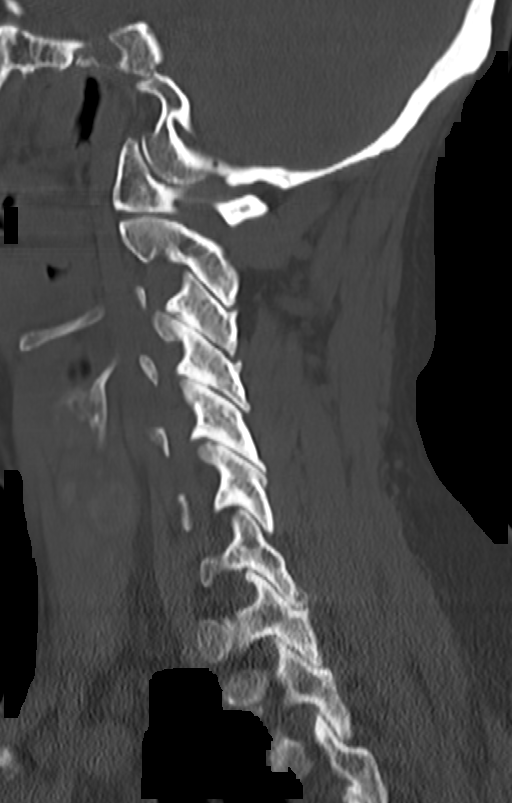
[im 25/60  bone]
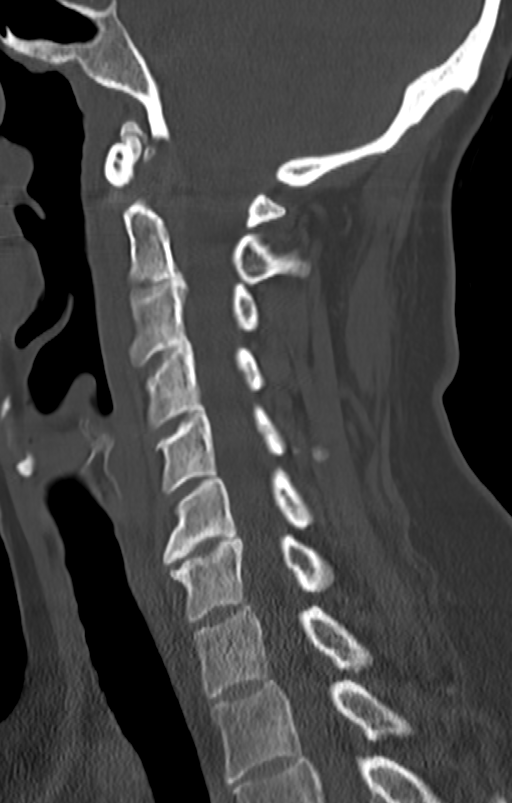
[im 30/60  soft-tissue]
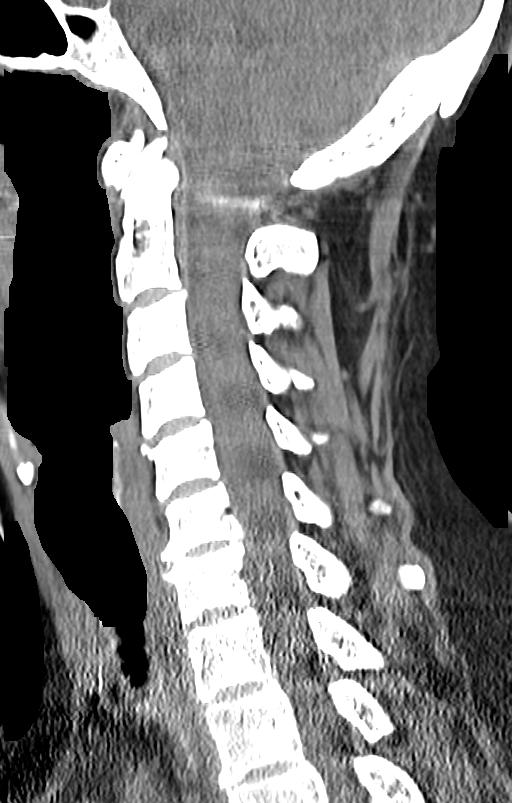
[im 30/60  bone]
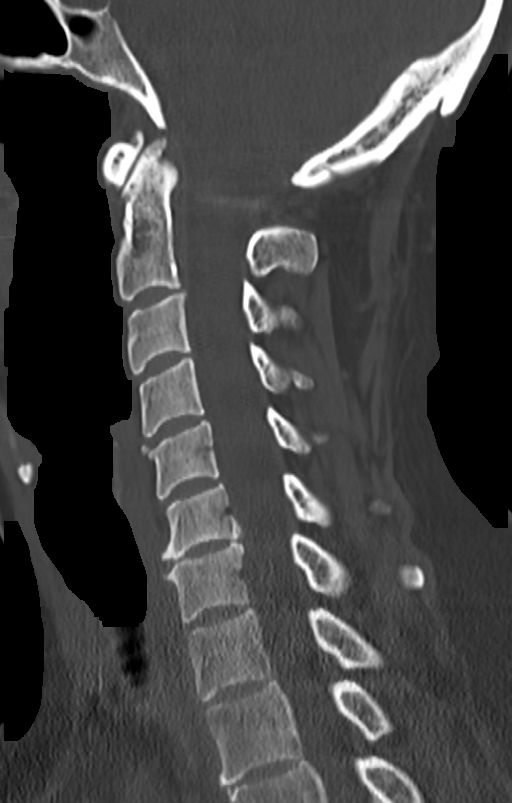
[im 35/60  bone]
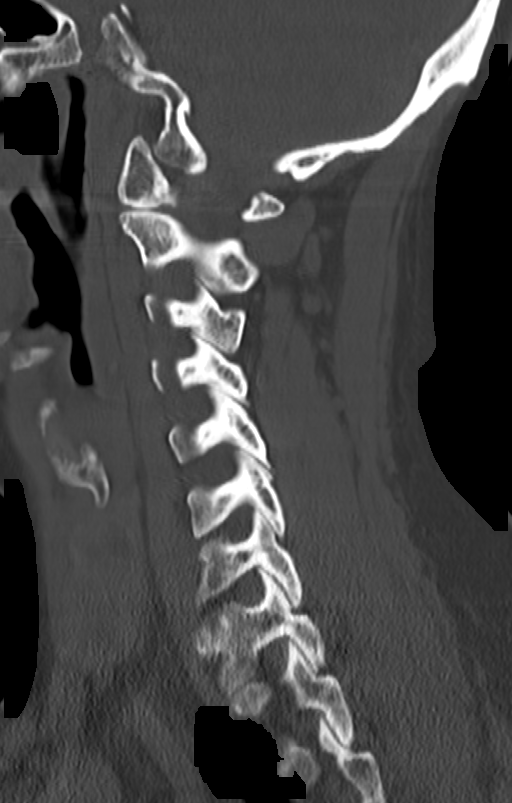
[im 40/60  bone]
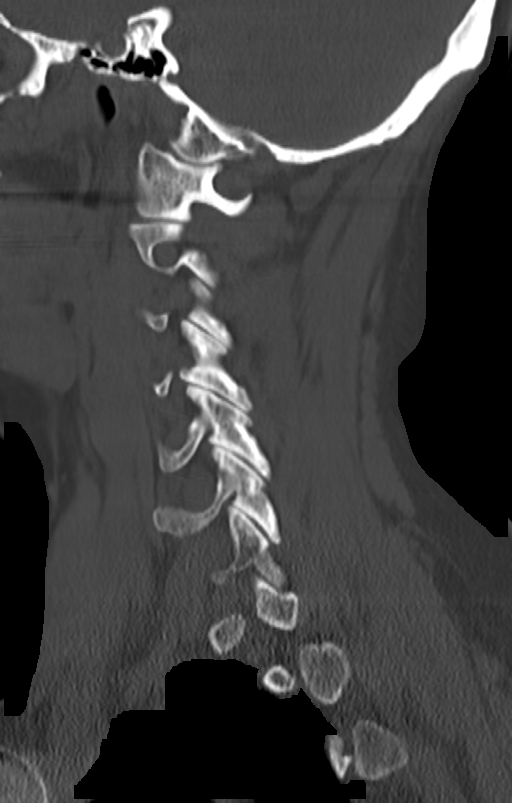

[Series 7: c-spine 2.00 hr60 s3 cor bone · coronal · 0.23mm/px · 3 of 60 slices shown]
[im 12/60  bone]
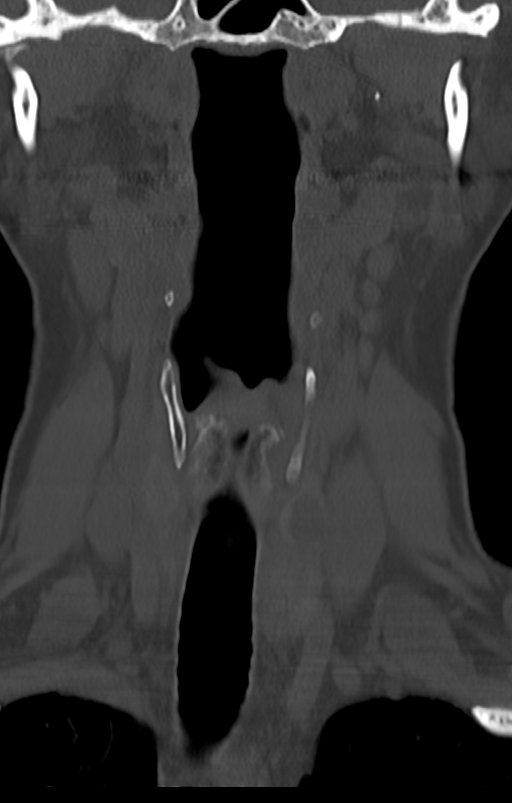
[im 24/60  bone]
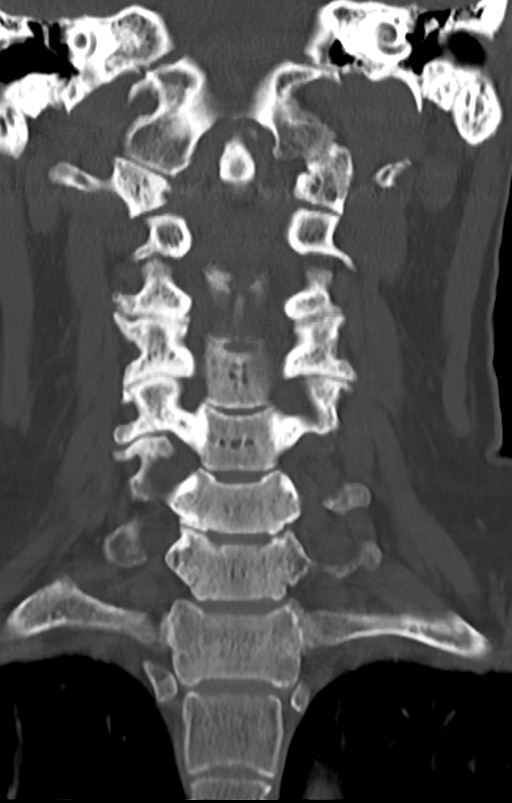
[im 36/60  bone]
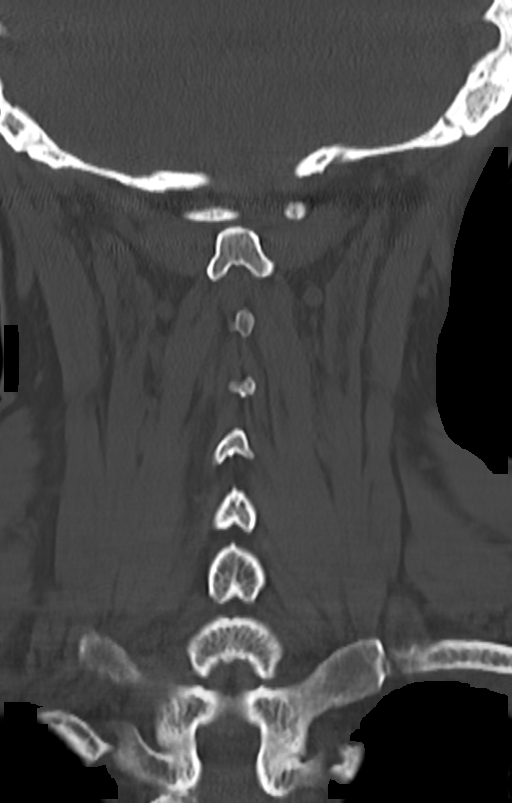

[11 of 33 positions shown; findings below may reference images not displayed]

FINDINGS: Alignment: There is straightening of the normal cervical lordosis.

Skull base and vertebrae: Visualized skull base is intact. No
atlanto-occipital dissociation. The vertebral body heights are well
maintained. No fracture or pathologic osseous lesion seen. There is
small anterior osteophytes seen at C6-C7.

Soft tissues and spinal canal: There is heterogeneously hypodense
lesion seen within the right thyroid gland which measures
approximately 1.5 cm. There is also a low-density lesion seen within
the upper portion of the right thyroid gland measuring 1.2 cm,
likely colloid cysts. The visualized paraspinal soft tissues are
unremarkable. No prevertebral soft tissue swelling is seen. The
spinal canal is unremarkable, no large epidural collection or
significant canal narrowing.

Disc levels:

C1-C2: Atlanto-axial junction is normal, without canal narrowing

C2-C3: No significant spinal canal or neural foraminal narrowing

C3-C4: Uncovertebral osteophytes are noted which causes mild right
neural foraminal narrowing.

C4-C5: Uncovertebral osteophytes and a minimal disc osteophyte
complex are seen which causes mild right neural foraminal narrowing.

C5-C6: Disc osteophyte complex and uncovertebral osteophytes are
seen, however no significant canal or neural foraminal narrowing.

C6-C7: Disc osteophyte complex and uncovertebral osteophytes are
seen which causes mild bilateral neural foraminal narrowing. There
is also mild effacement of anterior thecal sac.

C7-T1: No significant spinal canal or neural foraminal narrowing

Upper chest: The lung apices are clear. Thoracic inlet is within
normal limits.

Other: None
IMPRESSION: 1. Cervical spine spondylosis most notable at C6-C7 with mild neural
foraminal narrowing and mild effacement of the anterior thecal sac.
2. Straightening of the normal cervical lordosis.
3. Right thyroid gland heterogeneous lesion measuring 1.5 cm. Would
recommend thyroid ultrasound for further evaluation

## 2020-04-19 DIAGNOSIS — F4322 Adjustment disorder with anxiety: Secondary | ICD-10-CM | POA: Diagnosis not present

## 2020-04-30 DIAGNOSIS — F4323 Adjustment disorder with mixed anxiety and depressed mood: Secondary | ICD-10-CM | POA: Diagnosis not present

## 2020-05-03 DIAGNOSIS — F4322 Adjustment disorder with anxiety: Secondary | ICD-10-CM | POA: Diagnosis not present

## 2020-05-10 DIAGNOSIS — F4322 Adjustment disorder with anxiety: Secondary | ICD-10-CM | POA: Diagnosis not present

## 2020-05-25 DIAGNOSIS — F3342 Major depressive disorder, recurrent, in full remission: Secondary | ICD-10-CM | POA: Diagnosis not present

## 2020-05-31 DIAGNOSIS — F4322 Adjustment disorder with anxiety: Secondary | ICD-10-CM | POA: Diagnosis not present

## 2020-06-07 DIAGNOSIS — F4322 Adjustment disorder with anxiety: Secondary | ICD-10-CM | POA: Diagnosis not present

## 2020-06-21 DIAGNOSIS — F4322 Adjustment disorder with anxiety: Secondary | ICD-10-CM | POA: Diagnosis not present

## 2020-06-22 DIAGNOSIS — F4322 Adjustment disorder with anxiety: Secondary | ICD-10-CM | POA: Diagnosis not present

## 2020-06-28 DIAGNOSIS — F411 Generalized anxiety disorder: Secondary | ICD-10-CM | POA: Diagnosis not present

## 2020-06-28 DIAGNOSIS — F4322 Adjustment disorder with anxiety: Secondary | ICD-10-CM | POA: Diagnosis not present

## 2020-06-28 DIAGNOSIS — F331 Major depressive disorder, recurrent, moderate: Secondary | ICD-10-CM | POA: Diagnosis not present

## 2020-06-28 DIAGNOSIS — G4700 Insomnia, unspecified: Secondary | ICD-10-CM | POA: Diagnosis not present

## 2020-06-28 DIAGNOSIS — M797 Fibromyalgia: Secondary | ICD-10-CM | POA: Diagnosis not present

## 2020-06-29 ENCOUNTER — Other Ambulatory Visit: Payer: Self-pay | Admitting: Family Medicine

## 2020-06-29 DIAGNOSIS — E079 Disorder of thyroid, unspecified: Secondary | ICD-10-CM

## 2020-06-29 DIAGNOSIS — E0789 Other specified disorders of thyroid: Secondary | ICD-10-CM

## 2020-06-30 ENCOUNTER — Other Ambulatory Visit: Payer: Self-pay | Admitting: Family Medicine

## 2020-06-30 ENCOUNTER — Other Ambulatory Visit: Payer: Self-pay

## 2020-06-30 ENCOUNTER — Ambulatory Visit
Admission: RE | Admit: 2020-06-30 | Discharge: 2020-06-30 | Disposition: A | Discharge status: Home / Self Care | Payer: BC Managed Care – PPO | Source: Ambulatory Visit | Attending: Family Medicine | Admitting: Family Medicine

## 2020-06-30 DIAGNOSIS — M545 Low back pain, unspecified: Secondary | ICD-10-CM

## 2020-07-02 ENCOUNTER — Other Ambulatory Visit: Payer: Self-pay

## 2020-07-02 ENCOUNTER — Ambulatory Visit (INDEPENDENT_AMBULATORY_CARE_PROVIDER_SITE_OTHER): Payer: BC Managed Care – PPO | Admitting: Podiatry

## 2020-07-02 ENCOUNTER — Ambulatory Visit (INDEPENDENT_AMBULATORY_CARE_PROVIDER_SITE_OTHER): Payer: BC Managed Care – PPO

## 2020-07-02 DIAGNOSIS — M79672 Pain in left foot: Secondary | ICD-10-CM

## 2020-07-02 DIAGNOSIS — M722 Plantar fascial fibromatosis: Secondary | ICD-10-CM

## 2020-07-02 DIAGNOSIS — M79671 Pain in right foot: Secondary | ICD-10-CM

## 2020-07-02 NOTE — Patient Instructions (Signed)

## 2020-07-04 NOTE — Progress Notes (Signed)
Subjective:   Patient ID: Brandy Stone, female   DOB: 55 y.o.   MRN: [id number]   HPI Patient presents stating the heels continue to give her moderate discomfort and she feels like she needs more support   ROS      Objective:  Physical Exam  Neuro vascular status intact with chronic discomfort of the plantar fascial band bilateral with inflammation fluid of the medial band     Assessment:  Fasciitis-like symptomatology moderate grade bilateral with moderate depression of the arch     Plan:  H&P reviewed condition I want to try to get this better and we will get a go ahead and fit her for customized orthotics with castings done today.  Educated her on this and patient will be seen back when orthotics return and until that we will utilize ice stretch and shoe gear modifications  X-rays indicate that there is spurring of the heel region bilateral with moderate depression of the arch no indication stress fracture arthritis

## 2020-07-06 DIAGNOSIS — Z0000 Encounter for general adult medical examination without abnormal findings: Secondary | ICD-10-CM | POA: Diagnosis not present

## 2020-07-07 ENCOUNTER — Ambulatory Visit
Admission: RE | Admit: 2020-07-07 | Discharge: 2020-07-07 | Disposition: A | Discharge status: Home / Self Care | Payer: BC Managed Care – PPO | Source: Ambulatory Visit | Attending: Family Medicine | Admitting: Family Medicine

## 2020-07-07 DIAGNOSIS — E041 Nontoxic single thyroid nodule: Secondary | ICD-10-CM | POA: Diagnosis not present

## 2020-07-07 DIAGNOSIS — E0789 Other specified disorders of thyroid: Secondary | ICD-10-CM

## 2020-07-07 DIAGNOSIS — E079 Disorder of thyroid, unspecified: Secondary | ICD-10-CM

## 2020-07-09 DIAGNOSIS — E782 Mixed hyperlipidemia: Secondary | ICD-10-CM | POA: Diagnosis not present

## 2020-07-09 DIAGNOSIS — M5136 Other intervertebral disc degeneration, lumbar region: Secondary | ICD-10-CM | POA: Diagnosis not present

## 2020-07-09 DIAGNOSIS — M545 Low back pain, unspecified: Secondary | ICD-10-CM | POA: Diagnosis not present

## 2020-07-09 DIAGNOSIS — E042 Nontoxic multinodular goiter: Secondary | ICD-10-CM | POA: Diagnosis not present

## 2020-07-09 DIAGNOSIS — Z0000 Encounter for general adult medical examination without abnormal findings: Secondary | ICD-10-CM | POA: Diagnosis not present

## 2020-07-12 ENCOUNTER — Other Ambulatory Visit: Payer: Self-pay | Admitting: Family Medicine

## 2020-07-12 DIAGNOSIS — E041 Nontoxic single thyroid nodule: Secondary | ICD-10-CM

## 2020-07-23 ENCOUNTER — Ambulatory Visit
Admission: RE | Admit: 2020-07-23 | Discharge: 2020-07-23 | Disposition: A | Discharge status: Home / Self Care | Payer: BC Managed Care – PPO | Source: Ambulatory Visit | Attending: Family Medicine | Admitting: Family Medicine

## 2020-07-23 ENCOUNTER — Other Ambulatory Visit (HOSPITAL_COMMUNITY)
Admission: RE | Admit: 2020-07-23 | Discharge: 2020-07-23 | Disposition: A | Discharge status: Home / Self Care | Payer: BC Managed Care – PPO | Source: Ambulatory Visit | Attending: Physician Assistant | Admitting: Physician Assistant

## 2020-07-23 DIAGNOSIS — D34 Benign neoplasm of thyroid gland: Secondary | ICD-10-CM | POA: Insufficient documentation

## 2020-07-23 DIAGNOSIS — E041 Nontoxic single thyroid nodule: Secondary | ICD-10-CM

## 2020-07-26 LAB — CYTOLOGY - NON PAP

## 2020-08-04 DIAGNOSIS — F4323 Adjustment disorder with mixed anxiety and depressed mood: Secondary | ICD-10-CM | POA: Diagnosis not present

## 2020-08-05 DIAGNOSIS — E042 Nontoxic multinodular goiter: Secondary | ICD-10-CM | POA: Diagnosis not present

## 2020-08-05 DIAGNOSIS — R1310 Dysphagia, unspecified: Secondary | ICD-10-CM | POA: Diagnosis not present

## 2020-08-10 DIAGNOSIS — F4322 Adjustment disorder with anxiety: Secondary | ICD-10-CM | POA: Diagnosis not present

## 2020-08-16 ENCOUNTER — Ambulatory Visit (INDEPENDENT_AMBULATORY_CARE_PROVIDER_SITE_OTHER): Payer: BC Managed Care – PPO

## 2020-08-16 ENCOUNTER — Other Ambulatory Visit: Payer: Self-pay

## 2020-08-16 DIAGNOSIS — M722 Plantar fascial fibromatosis: Secondary | ICD-10-CM

## 2020-08-16 NOTE — Progress Notes (Signed)
Patient in office today to pick-up custom orthotics. Orthotics were tried on and patient voiced satisfaction with the fit of the orthotics. The break-in process was explained in detail and patient verbalized understanding. Advised to call the office with any questions, comments or concerns.

## 2020-08-18 DIAGNOSIS — F4323 Adjustment disorder with mixed anxiety and depressed mood: Secondary | ICD-10-CM | POA: Diagnosis not present

## 2020-08-23 DIAGNOSIS — F4322 Adjustment disorder with anxiety: Secondary | ICD-10-CM | POA: Diagnosis not present

## 2020-09-01 ENCOUNTER — Other Ambulatory Visit: Payer: Self-pay | Admitting: Family Medicine

## 2020-09-01 DIAGNOSIS — R1310 Dysphagia, unspecified: Secondary | ICD-10-CM

## 2020-09-03 DIAGNOSIS — F4323 Adjustment disorder with mixed anxiety and depressed mood: Secondary | ICD-10-CM | POA: Diagnosis not present

## 2020-10-25 DIAGNOSIS — F4322 Adjustment disorder with anxiety: Secondary | ICD-10-CM | POA: Diagnosis not present

## 2020-11-03 DIAGNOSIS — F4323 Adjustment disorder with mixed anxiety and depressed mood: Secondary | ICD-10-CM | POA: Diagnosis not present

## 2020-11-22 DIAGNOSIS — F4322 Adjustment disorder with anxiety: Secondary | ICD-10-CM | POA: Diagnosis not present

## 2020-11-29 DIAGNOSIS — F4322 Adjustment disorder with anxiety: Secondary | ICD-10-CM | POA: Diagnosis not present

## 2020-12-06 DIAGNOSIS — F4322 Adjustment disorder with anxiety: Secondary | ICD-10-CM | POA: Diagnosis not present

## 2020-12-08 DIAGNOSIS — L814 Other melanin hyperpigmentation: Secondary | ICD-10-CM | POA: Diagnosis not present

## 2020-12-08 DIAGNOSIS — L578 Other skin changes due to chronic exposure to nonionizing radiation: Secondary | ICD-10-CM | POA: Diagnosis not present

## 2020-12-08 DIAGNOSIS — L719 Rosacea, unspecified: Secondary | ICD-10-CM | POA: Diagnosis not present

## 2020-12-08 DIAGNOSIS — L821 Other seborrheic keratosis: Secondary | ICD-10-CM | POA: Diagnosis not present

## 2020-12-08 DIAGNOSIS — D225 Melanocytic nevi of trunk: Secondary | ICD-10-CM | POA: Diagnosis not present

## 2020-12-08 DIAGNOSIS — D485 Neoplasm of uncertain behavior of skin: Secondary | ICD-10-CM | POA: Diagnosis not present

## 2020-12-21 DIAGNOSIS — F411 Generalized anxiety disorder: Secondary | ICD-10-CM | POA: Diagnosis not present

## 2021-01-12 DIAGNOSIS — Z1231 Encounter for screening mammogram for malignant neoplasm of breast: Secondary | ICD-10-CM | POA: Diagnosis not present

## 2021-01-12 DIAGNOSIS — L719 Rosacea, unspecified: Secondary | ICD-10-CM | POA: Insufficient documentation

## 2021-01-12 DIAGNOSIS — Z01419 Encounter for gynecological examination (general) (routine) without abnormal findings: Secondary | ICD-10-CM | POA: Diagnosis not present

## 2021-01-12 DIAGNOSIS — Z6827 Body mass index (BMI) 27.0-27.9, adult: Secondary | ICD-10-CM | POA: Diagnosis not present

## 2021-01-12 DIAGNOSIS — E041 Nontoxic single thyroid nodule: Secondary | ICD-10-CM | POA: Insufficient documentation

## 2021-01-12 DIAGNOSIS — F329 Major depressive disorder, single episode, unspecified: Secondary | ICD-10-CM | POA: Diagnosis not present

## 2021-01-12 DIAGNOSIS — Z79890 Hormone replacement therapy (postmenopausal): Secondary | ICD-10-CM | POA: Diagnosis not present

## 2021-05-31 ENCOUNTER — Other Ambulatory Visit: Payer: Self-pay | Admitting: Family Medicine

## 2021-05-31 DIAGNOSIS — E041 Nontoxic single thyroid nodule: Secondary | ICD-10-CM

## 2021-07-08 DIAGNOSIS — Z0000 Encounter for general adult medical examination without abnormal findings: Secondary | ICD-10-CM | POA: Diagnosis not present

## 2021-07-08 DIAGNOSIS — Z114 Encounter for screening for human immunodeficiency virus [HIV]: Secondary | ICD-10-CM | POA: Diagnosis not present

## 2021-07-12 DIAGNOSIS — Z1211 Encounter for screening for malignant neoplasm of colon: Secondary | ICD-10-CM | POA: Diagnosis not present

## 2021-07-12 DIAGNOSIS — Z23 Encounter for immunization: Secondary | ICD-10-CM | POA: Diagnosis not present

## 2021-07-12 DIAGNOSIS — Z0000 Encounter for general adult medical examination without abnormal findings: Secondary | ICD-10-CM | POA: Diagnosis not present

## 2021-08-04 DIAGNOSIS — F339 Major depressive disorder, recurrent, unspecified: Secondary | ICD-10-CM | POA: Diagnosis not present

## 2021-08-04 DIAGNOSIS — F411 Generalized anxiety disorder: Secondary | ICD-10-CM | POA: Diagnosis not present

## 2021-08-05 DIAGNOSIS — M797 Fibromyalgia: Secondary | ICD-10-CM | POA: Diagnosis not present

## 2021-08-05 DIAGNOSIS — F5101 Primary insomnia: Secondary | ICD-10-CM | POA: Diagnosis not present

## 2021-08-10 DIAGNOSIS — Z7185 Encounter for immunization safety counseling: Secondary | ICD-10-CM | POA: Diagnosis not present

## 2021-08-10 DIAGNOSIS — Z23 Encounter for immunization: Secondary | ICD-10-CM | POA: Diagnosis not present

## 2021-08-10 DIAGNOSIS — M7630 Iliotibial band syndrome, unspecified leg: Secondary | ICD-10-CM | POA: Diagnosis not present

## 2021-08-10 DIAGNOSIS — I10 Essential (primary) hypertension: Secondary | ICD-10-CM | POA: Diagnosis not present

## 2021-08-10 DIAGNOSIS — M545 Low back pain, unspecified: Secondary | ICD-10-CM | POA: Diagnosis not present

## 2021-08-12 DIAGNOSIS — F411 Generalized anxiety disorder: Secondary | ICD-10-CM | POA: Diagnosis not present

## 2021-08-12 DIAGNOSIS — F339 Major depressive disorder, recurrent, unspecified: Secondary | ICD-10-CM | POA: Diagnosis not present

## 2021-08-18 DIAGNOSIS — F339 Major depressive disorder, recurrent, unspecified: Secondary | ICD-10-CM | POA: Diagnosis not present

## 2021-08-18 DIAGNOSIS — F411 Generalized anxiety disorder: Secondary | ICD-10-CM | POA: Diagnosis not present

## 2021-08-25 DIAGNOSIS — F339 Major depressive disorder, recurrent, unspecified: Secondary | ICD-10-CM | POA: Diagnosis not present

## 2021-08-25 DIAGNOSIS — F411 Generalized anxiety disorder: Secondary | ICD-10-CM | POA: Diagnosis not present

## 2021-09-01 DIAGNOSIS — F411 Generalized anxiety disorder: Secondary | ICD-10-CM | POA: Diagnosis not present

## 2021-09-01 DIAGNOSIS — F339 Major depressive disorder, recurrent, unspecified: Secondary | ICD-10-CM | POA: Diagnosis not present

## 2021-09-08 DIAGNOSIS — F411 Generalized anxiety disorder: Secondary | ICD-10-CM | POA: Diagnosis not present

## 2021-09-08 DIAGNOSIS — F339 Major depressive disorder, recurrent, unspecified: Secondary | ICD-10-CM | POA: Diagnosis not present

## 2021-09-15 DIAGNOSIS — F339 Major depressive disorder, recurrent, unspecified: Secondary | ICD-10-CM | POA: Diagnosis not present

## 2021-09-15 DIAGNOSIS — F411 Generalized anxiety disorder: Secondary | ICD-10-CM | POA: Diagnosis not present

## 2021-09-21 DIAGNOSIS — J069 Acute upper respiratory infection, unspecified: Secondary | ICD-10-CM | POA: Diagnosis not present

## 2021-09-23 DIAGNOSIS — F411 Generalized anxiety disorder: Secondary | ICD-10-CM | POA: Diagnosis not present

## 2021-09-23 DIAGNOSIS — F339 Major depressive disorder, recurrent, unspecified: Secondary | ICD-10-CM | POA: Diagnosis not present

## 2021-10-27 DIAGNOSIS — F339 Major depressive disorder, recurrent, unspecified: Secondary | ICD-10-CM | POA: Diagnosis not present

## 2021-10-27 DIAGNOSIS — F411 Generalized anxiety disorder: Secondary | ICD-10-CM | POA: Diagnosis not present

## 2021-11-03 DIAGNOSIS — F411 Generalized anxiety disorder: Secondary | ICD-10-CM | POA: Diagnosis not present

## 2021-11-03 DIAGNOSIS — F339 Major depressive disorder, recurrent, unspecified: Secondary | ICD-10-CM | POA: Diagnosis not present

## 2021-11-24 DIAGNOSIS — F339 Major depressive disorder, recurrent, unspecified: Secondary | ICD-10-CM | POA: Diagnosis not present

## 2021-11-24 DIAGNOSIS — F411 Generalized anxiety disorder: Secondary | ICD-10-CM | POA: Diagnosis not present

## 2021-12-01 DIAGNOSIS — F411 Generalized anxiety disorder: Secondary | ICD-10-CM | POA: Diagnosis not present

## 2021-12-01 DIAGNOSIS — F339 Major depressive disorder, recurrent, unspecified: Secondary | ICD-10-CM | POA: Diagnosis not present

## 2021-12-08 DIAGNOSIS — F411 Generalized anxiety disorder: Secondary | ICD-10-CM | POA: Diagnosis not present

## 2021-12-08 DIAGNOSIS — F339 Major depressive disorder, recurrent, unspecified: Secondary | ICD-10-CM | POA: Diagnosis not present

## 2021-12-13 DIAGNOSIS — L814 Other melanin hyperpigmentation: Secondary | ICD-10-CM | POA: Diagnosis not present

## 2021-12-13 DIAGNOSIS — L821 Other seborrheic keratosis: Secondary | ICD-10-CM | POA: Diagnosis not present

## 2021-12-13 DIAGNOSIS — L719 Rosacea, unspecified: Secondary | ICD-10-CM | POA: Diagnosis not present

## 2021-12-13 DIAGNOSIS — L578 Other skin changes due to chronic exposure to nonionizing radiation: Secondary | ICD-10-CM | POA: Diagnosis not present

## 2021-12-15 DIAGNOSIS — F411 Generalized anxiety disorder: Secondary | ICD-10-CM | POA: Diagnosis not present

## 2021-12-15 DIAGNOSIS — F339 Major depressive disorder, recurrent, unspecified: Secondary | ICD-10-CM | POA: Diagnosis not present

## 2021-12-21 DIAGNOSIS — F339 Major depressive disorder, recurrent, unspecified: Secondary | ICD-10-CM | POA: Diagnosis not present

## 2021-12-21 DIAGNOSIS — F411 Generalized anxiety disorder: Secondary | ICD-10-CM | POA: Diagnosis not present

## 2021-12-29 DIAGNOSIS — F339 Major depressive disorder, recurrent, unspecified: Secondary | ICD-10-CM | POA: Diagnosis not present

## 2021-12-29 DIAGNOSIS — F411 Generalized anxiety disorder: Secondary | ICD-10-CM | POA: Diagnosis not present

## 2022-01-05 DIAGNOSIS — F411 Generalized anxiety disorder: Secondary | ICD-10-CM | POA: Diagnosis not present

## 2022-01-05 DIAGNOSIS — F339 Major depressive disorder, recurrent, unspecified: Secondary | ICD-10-CM | POA: Diagnosis not present

## 2022-01-18 DIAGNOSIS — F411 Generalized anxiety disorder: Secondary | ICD-10-CM | POA: Diagnosis not present

## 2022-01-18 DIAGNOSIS — F339 Major depressive disorder, recurrent, unspecified: Secondary | ICD-10-CM | POA: Diagnosis not present

## 2022-01-26 DIAGNOSIS — F339 Major depressive disorder, recurrent, unspecified: Secondary | ICD-10-CM | POA: Diagnosis not present

## 2022-01-26 DIAGNOSIS — F411 Generalized anxiety disorder: Secondary | ICD-10-CM | POA: Diagnosis not present

## 2022-02-01 DIAGNOSIS — F339 Major depressive disorder, recurrent, unspecified: Secondary | ICD-10-CM | POA: Diagnosis not present

## 2022-02-01 DIAGNOSIS — F411 Generalized anxiety disorder: Secondary | ICD-10-CM | POA: Diagnosis not present

## 2022-02-09 DIAGNOSIS — F411 Generalized anxiety disorder: Secondary | ICD-10-CM | POA: Diagnosis not present

## 2022-02-09 DIAGNOSIS — F339 Major depressive disorder, recurrent, unspecified: Secondary | ICD-10-CM | POA: Diagnosis not present

## 2022-02-10 DIAGNOSIS — Z01419 Encounter for gynecological examination (general) (routine) without abnormal findings: Secondary | ICD-10-CM | POA: Diagnosis not present

## 2022-02-10 DIAGNOSIS — Z1231 Encounter for screening mammogram for malignant neoplasm of breast: Secondary | ICD-10-CM | POA: Diagnosis not present

## 2022-02-10 DIAGNOSIS — Z6829 Body mass index (BMI) 29.0-29.9, adult: Secondary | ICD-10-CM | POA: Diagnosis not present

## 2022-02-10 DIAGNOSIS — R5383 Other fatigue: Secondary | ICD-10-CM | POA: Diagnosis not present

## 2022-02-15 ENCOUNTER — Other Ambulatory Visit: Payer: Self-pay | Admitting: Obstetrics and Gynecology

## 2022-02-15 DIAGNOSIS — R928 Other abnormal and inconclusive findings on diagnostic imaging of breast: Secondary | ICD-10-CM

## 2022-02-16 DIAGNOSIS — F411 Generalized anxiety disorder: Secondary | ICD-10-CM | POA: Diagnosis not present

## 2022-02-16 DIAGNOSIS — F339 Major depressive disorder, recurrent, unspecified: Secondary | ICD-10-CM | POA: Diagnosis not present

## 2022-02-23 DIAGNOSIS — F411 Generalized anxiety disorder: Secondary | ICD-10-CM | POA: Diagnosis not present

## 2022-02-23 DIAGNOSIS — F339 Major depressive disorder, recurrent, unspecified: Secondary | ICD-10-CM | POA: Diagnosis not present

## 2022-02-28 ENCOUNTER — Ambulatory Visit
Admission: RE | Admit: 2022-02-28 | Discharge: 2022-02-28 | Disposition: A | Discharge status: Home / Self Care | Payer: BC Managed Care – PPO | Source: Ambulatory Visit | Attending: Obstetrics and Gynecology | Admitting: Obstetrics and Gynecology

## 2022-02-28 DIAGNOSIS — R92321 Mammographic fibroglandular density, right breast: Secondary | ICD-10-CM | POA: Diagnosis not present

## 2022-02-28 DIAGNOSIS — R928 Other abnormal and inconclusive findings on diagnostic imaging of breast: Secondary | ICD-10-CM

## 2022-02-28 DIAGNOSIS — N6011 Diffuse cystic mastopathy of right breast: Secondary | ICD-10-CM | POA: Diagnosis not present

## 2022-03-09 DIAGNOSIS — F339 Major depressive disorder, recurrent, unspecified: Secondary | ICD-10-CM | POA: Diagnosis not present

## 2022-03-09 DIAGNOSIS — F411 Generalized anxiety disorder: Secondary | ICD-10-CM | POA: Diagnosis not present

## 2022-03-16 DIAGNOSIS — F411 Generalized anxiety disorder: Secondary | ICD-10-CM | POA: Diagnosis not present

## 2022-03-16 DIAGNOSIS — F339 Major depressive disorder, recurrent, unspecified: Secondary | ICD-10-CM | POA: Diagnosis not present

## 2022-03-23 DIAGNOSIS — F339 Major depressive disorder, recurrent, unspecified: Secondary | ICD-10-CM | POA: Diagnosis not present

## 2022-03-23 DIAGNOSIS — F411 Generalized anxiety disorder: Secondary | ICD-10-CM | POA: Diagnosis not present

## 2022-04-06 DIAGNOSIS — F411 Generalized anxiety disorder: Secondary | ICD-10-CM | POA: Diagnosis not present

## 2022-04-06 DIAGNOSIS — F339 Major depressive disorder, recurrent, unspecified: Secondary | ICD-10-CM | POA: Diagnosis not present

## 2022-04-13 DIAGNOSIS — F411 Generalized anxiety disorder: Secondary | ICD-10-CM | POA: Diagnosis not present

## 2022-04-13 DIAGNOSIS — F339 Major depressive disorder, recurrent, unspecified: Secondary | ICD-10-CM | POA: Diagnosis not present

## 2022-04-18 DIAGNOSIS — F339 Major depressive disorder, recurrent, unspecified: Secondary | ICD-10-CM | POA: Diagnosis not present

## 2022-04-18 DIAGNOSIS — F411 Generalized anxiety disorder: Secondary | ICD-10-CM | POA: Diagnosis not present

## 2022-04-20 DIAGNOSIS — F339 Major depressive disorder, recurrent, unspecified: Secondary | ICD-10-CM | POA: Diagnosis not present

## 2022-04-20 DIAGNOSIS — F411 Generalized anxiety disorder: Secondary | ICD-10-CM | POA: Diagnosis not present

## 2022-05-01 DIAGNOSIS — M797 Fibromyalgia: Secondary | ICD-10-CM | POA: Diagnosis not present

## 2022-05-01 DIAGNOSIS — F5101 Primary insomnia: Secondary | ICD-10-CM | POA: Diagnosis not present

## 2022-05-02 DIAGNOSIS — F411 Generalized anxiety disorder: Secondary | ICD-10-CM | POA: Diagnosis not present

## 2022-05-02 DIAGNOSIS — F339 Major depressive disorder, recurrent, unspecified: Secondary | ICD-10-CM | POA: Diagnosis not present

## 2022-05-05 DIAGNOSIS — F411 Generalized anxiety disorder: Secondary | ICD-10-CM | POA: Diagnosis not present

## 2022-05-05 DIAGNOSIS — F339 Major depressive disorder, recurrent, unspecified: Secondary | ICD-10-CM | POA: Diagnosis not present

## 2022-05-11 DIAGNOSIS — F411 Generalized anxiety disorder: Secondary | ICD-10-CM | POA: Diagnosis not present

## 2022-05-11 DIAGNOSIS — F339 Major depressive disorder, recurrent, unspecified: Secondary | ICD-10-CM | POA: Diagnosis not present

## 2022-05-18 DIAGNOSIS — F339 Major depressive disorder, recurrent, unspecified: Secondary | ICD-10-CM | POA: Diagnosis not present

## 2022-05-18 DIAGNOSIS — F411 Generalized anxiety disorder: Secondary | ICD-10-CM | POA: Diagnosis not present

## 2022-05-30 DIAGNOSIS — F411 Generalized anxiety disorder: Secondary | ICD-10-CM | POA: Diagnosis not present

## 2022-05-30 DIAGNOSIS — F339 Major depressive disorder, recurrent, unspecified: Secondary | ICD-10-CM | POA: Diagnosis not present

## 2022-06-01 DIAGNOSIS — F339 Major depressive disorder, recurrent, unspecified: Secondary | ICD-10-CM | POA: Diagnosis not present

## 2022-06-01 DIAGNOSIS — F411 Generalized anxiety disorder: Secondary | ICD-10-CM | POA: Diagnosis not present

## 2022-06-08 DIAGNOSIS — F411 Generalized anxiety disorder: Secondary | ICD-10-CM | POA: Diagnosis not present

## 2022-06-08 DIAGNOSIS — F339 Major depressive disorder, recurrent, unspecified: Secondary | ICD-10-CM | POA: Diagnosis not present

## 2022-06-15 DIAGNOSIS — F339 Major depressive disorder, recurrent, unspecified: Secondary | ICD-10-CM | POA: Diagnosis not present

## 2022-06-15 DIAGNOSIS — F411 Generalized anxiety disorder: Secondary | ICD-10-CM | POA: Diagnosis not present

## 2022-06-29 DIAGNOSIS — F339 Major depressive disorder, recurrent, unspecified: Secondary | ICD-10-CM | POA: Diagnosis not present

## 2022-06-29 DIAGNOSIS — F411 Generalized anxiety disorder: Secondary | ICD-10-CM | POA: Diagnosis not present

## 2022-07-06 DIAGNOSIS — F411 Generalized anxiety disorder: Secondary | ICD-10-CM | POA: Diagnosis not present

## 2022-07-06 DIAGNOSIS — F339 Major depressive disorder, recurrent, unspecified: Secondary | ICD-10-CM | POA: Diagnosis not present

## 2022-07-13 DIAGNOSIS — F411 Generalized anxiety disorder: Secondary | ICD-10-CM | POA: Diagnosis not present

## 2022-07-13 DIAGNOSIS — M542 Cervicalgia: Secondary | ICD-10-CM | POA: Diagnosis not present

## 2022-07-13 DIAGNOSIS — F339 Major depressive disorder, recurrent, unspecified: Secondary | ICD-10-CM | POA: Diagnosis not present

## 2022-07-13 DIAGNOSIS — M545 Low back pain, unspecified: Secondary | ICD-10-CM | POA: Diagnosis not present

## 2022-07-17 DIAGNOSIS — M542 Cervicalgia: Secondary | ICD-10-CM | POA: Diagnosis not present

## 2022-07-17 DIAGNOSIS — M545 Low back pain, unspecified: Secondary | ICD-10-CM | POA: Diagnosis not present

## 2022-07-20 DIAGNOSIS — F411 Generalized anxiety disorder: Secondary | ICD-10-CM | POA: Diagnosis not present

## 2022-07-20 DIAGNOSIS — F339 Major depressive disorder, recurrent, unspecified: Secondary | ICD-10-CM | POA: Diagnosis not present

## 2022-07-21 DIAGNOSIS — M542 Cervicalgia: Secondary | ICD-10-CM | POA: Diagnosis not present

## 2022-07-21 DIAGNOSIS — M545 Low back pain, unspecified: Secondary | ICD-10-CM | POA: Diagnosis not present

## 2022-07-25 DIAGNOSIS — M542 Cervicalgia: Secondary | ICD-10-CM | POA: Diagnosis not present

## 2022-07-25 DIAGNOSIS — M545 Low back pain, unspecified: Secondary | ICD-10-CM | POA: Diagnosis not present

## 2022-07-27 DIAGNOSIS — F339 Major depressive disorder, recurrent, unspecified: Secondary | ICD-10-CM | POA: Diagnosis not present

## 2022-07-27 DIAGNOSIS — F411 Generalized anxiety disorder: Secondary | ICD-10-CM | POA: Diagnosis not present

## 2022-07-28 DIAGNOSIS — M545 Low back pain, unspecified: Secondary | ICD-10-CM | POA: Diagnosis not present

## 2022-07-28 DIAGNOSIS — M542 Cervicalgia: Secondary | ICD-10-CM | POA: Diagnosis not present

## 2022-07-31 IMAGING — US US THYROID
1 series · 13 of 25 positions shown · non-contrast
Comparison: 11/12/2018 C-spine CT

CLINICAL DATA: Bilateral thyroid nodules by cervical spine CT

EXAM:
THYROID ULTRASOUND
TECHNIQUE: Ultrasound examination of the thyroid gland and adjacent soft
tissues was performed.

[Series 1: us thyroid · 0.08mm/px · 13 of 56 slices shown]
[im 1/56]
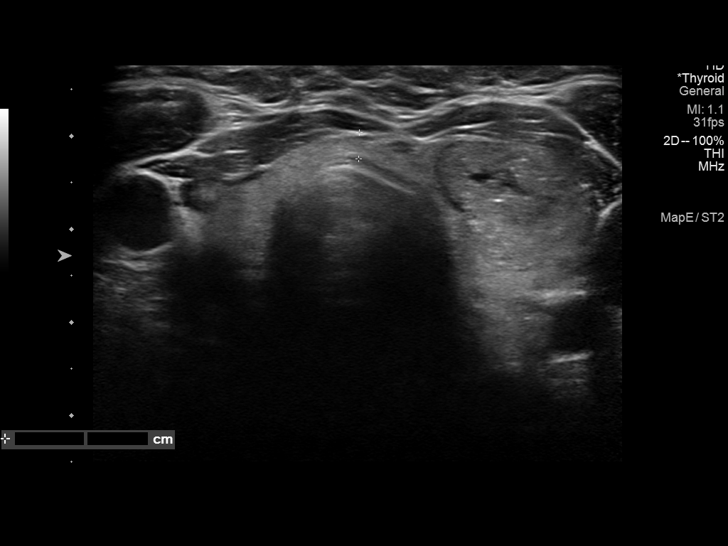
[im 5/56]
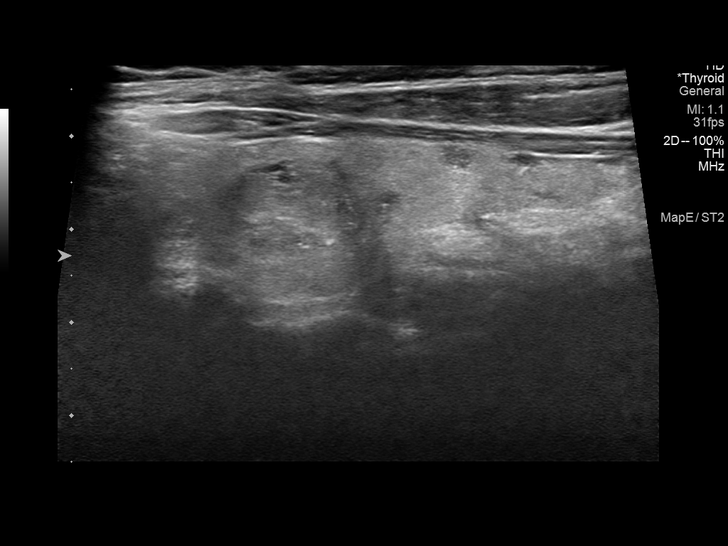
[im 10/56]
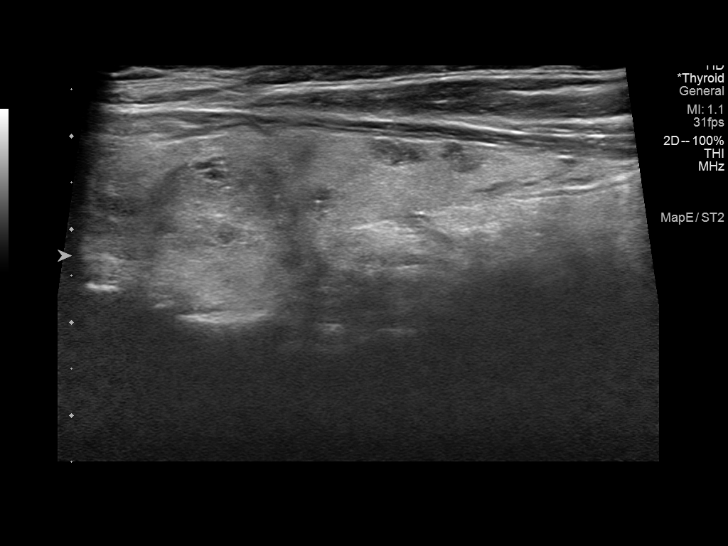
[im 14/56]
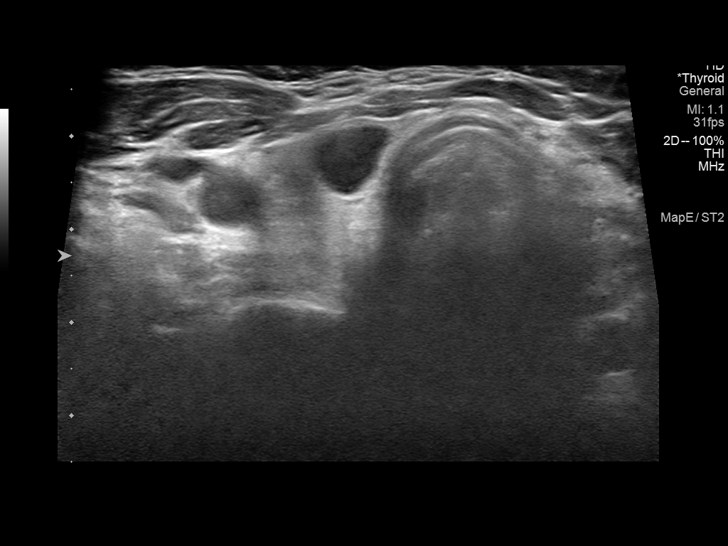
[im 19/56]
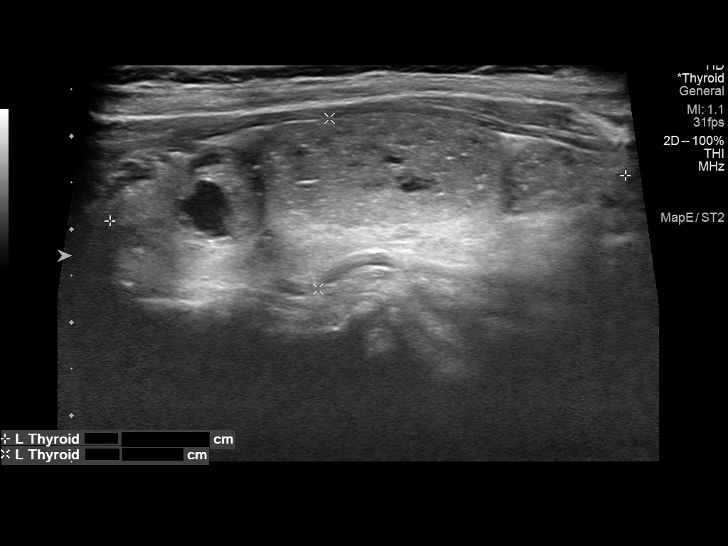
[im 23/56]
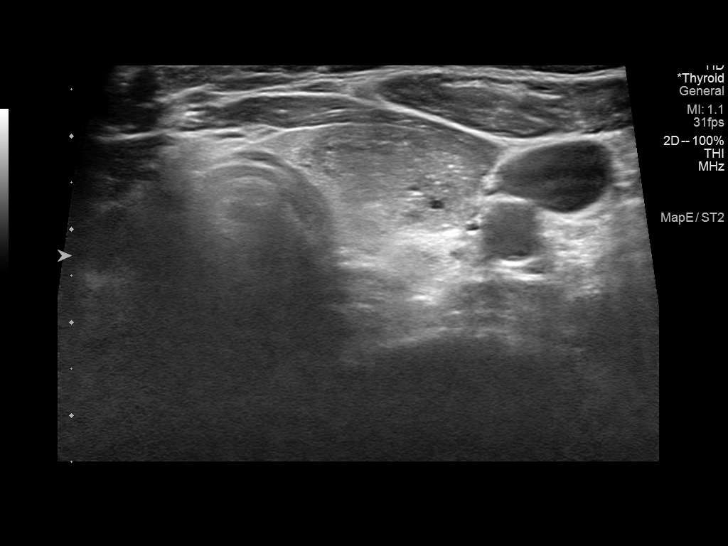
[im 28/56]
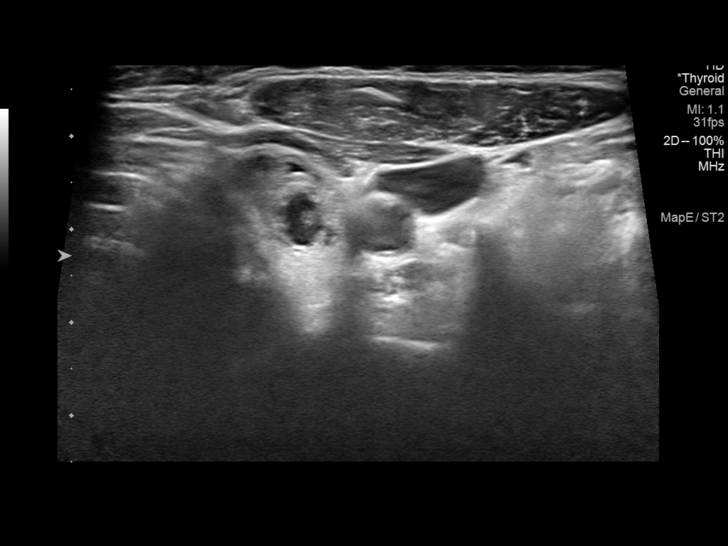
[im 33/56]
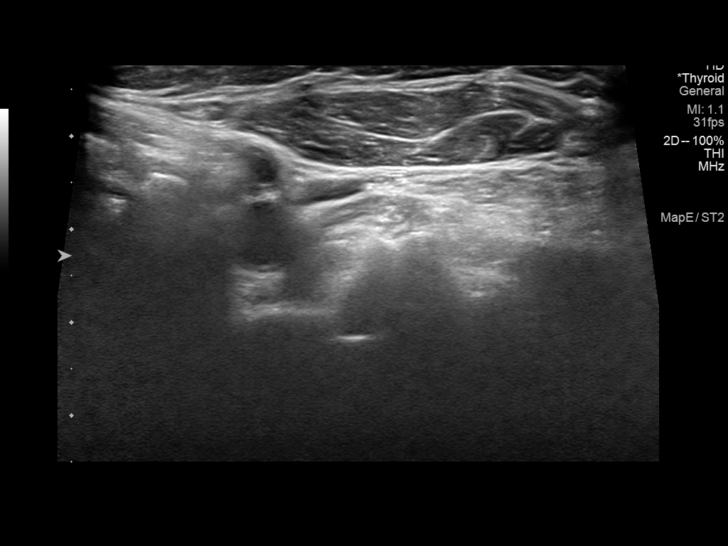
[im 37/56]
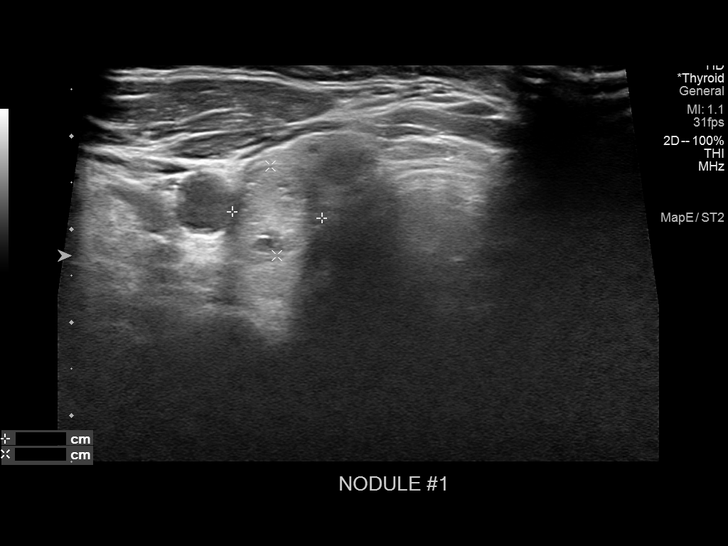
[im 42/56]
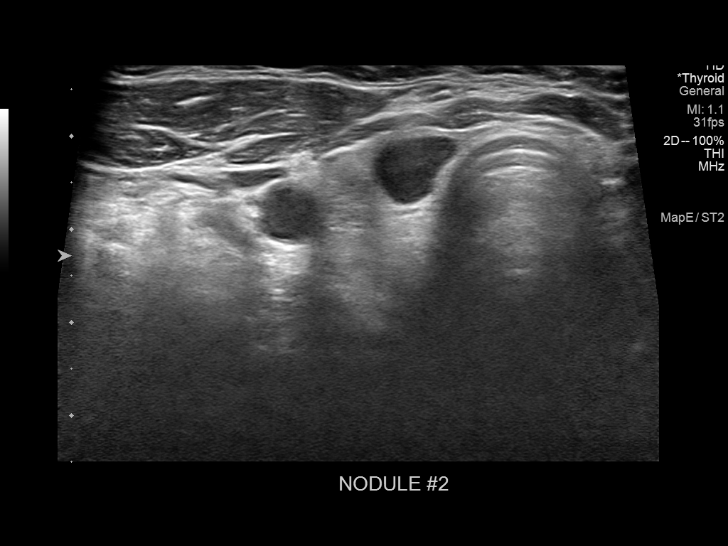
[im 46/56]
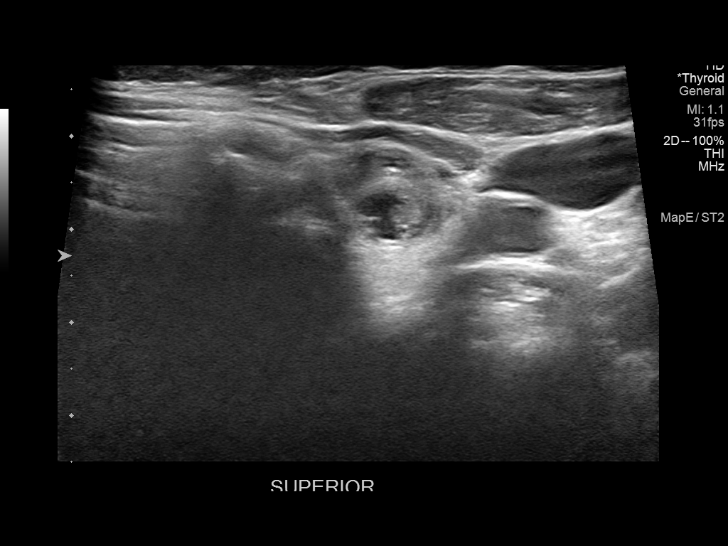
[im 51/56]
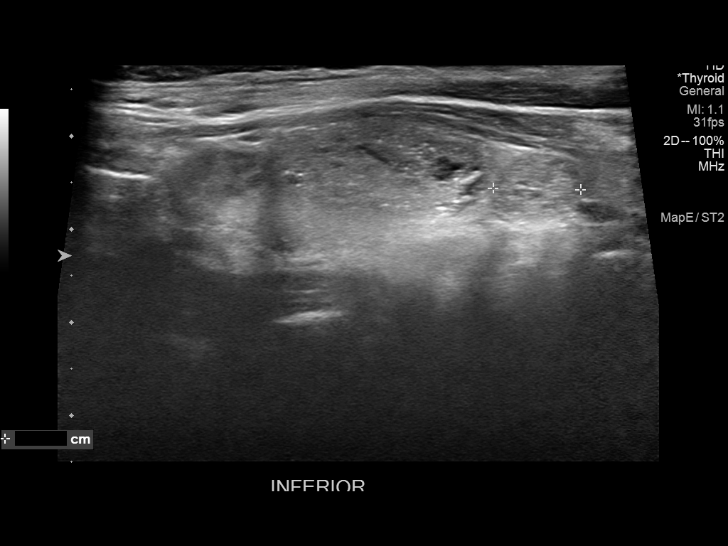
[im 56/56]
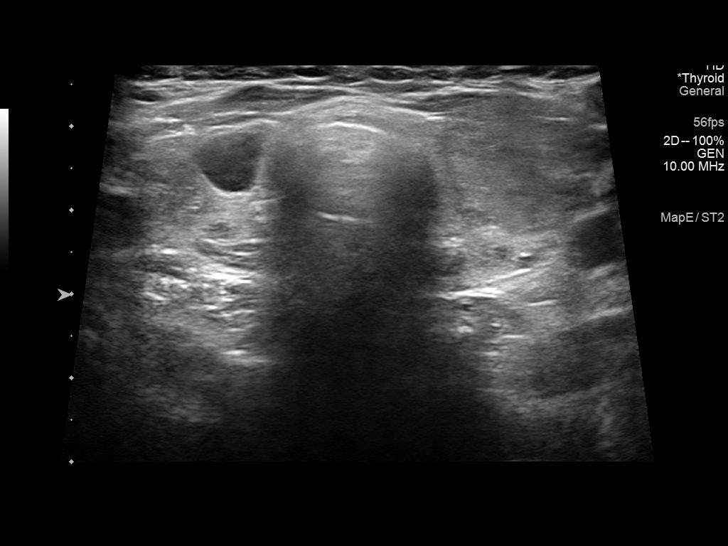

[13 of 25 positions shown; findings below may reference images not displayed]

FINDINGS: Parenchymal Echotexture: Moderately heterogenous

Isthmus: 3 mm

Right lobe: 5.5 x 1.8 x 1.5 cm

Left lobe: 5.6 x 1.8 x 1.8 cm

_________________________________________________________

Estimated total number of nodules >/= 1 cm: 5

Number of spongiform nodules >/=  2 cm not described below (TR1): 0

Number of mixed cystic and solid nodules >/= 1.5 cm not described
below (TR2): 0

_________________________________________________________

Nodule # 1:

Location: Right; Superior

Maximum size: 1.6 cm; Other 2 dimensions: 1.0 x 1.0 cm

Composition: solid/almost completely solid (2)

Echogenicity: isoechoic (1)

Shape: not taller-than-wide (0)

Margins: ill-defined (0)

Echogenic foci: none (0)

ACR TI-RADS total points: 3.

ACR TI-RADS risk category: TR3 (3 points).

ACR TI-RADS recommendations:

*Given size (>/= 1.5 - 2.4 cm) and appearance, a follow-up
ultrasound in 1 year should be considered based on TI-RADS criteria.

_________________________________________________________

Nodule # 3:

Location: Left; Superior

Maximum size: 1.0 cm; Other 2 dimensions: 1.0 x 1.0 cm

Composition: mixed cystic and solid (1)

Echogenicity: isoechoic (1)

Shape: not taller-than-wide (0)

Margins: ill-defined (0)

Echogenic foci: none (0)

ACR TI-RADS total points: 2.

ACR TI-RADS risk category: TR2 (2 points).

ACR TI-RADS recommendations:

This nodule does NOT meet TI-RADS criteria for biopsy or dedicated
follow-up.

_________________________________________________________

Nodule # 4:

Location: Left; Mid

Maximum size: 2.7 cm; Other 2 dimensions: 1.9 x 1.2 cm

Composition: solid/almost completely solid (2)

Echogenicity: isoechoic (1)

Shape: not taller-than-wide (0)

Margins: ill-defined (0)

Echogenic foci: punctate echogenic foci (3)

ACR TI-RADS total points: 6.

ACR TI-RADS risk category: TR4 (4-6 points).

ACR TI-RADS recommendations:

**Given size (>/= 1.5 cm) and appearance, fine needle aspiration of
this moderately suspicious nodule should be considered based on
TI-RADS criteria.

_________________________________________________________

There are additional cystic and isoechoic nodules noted bilaterally,
all measuring 10 mm or less in size. These would not meet criteria
for any biopsy or follow-up and are not fully described by TI rads
criteria. Dominant nodules as above.

No hypervascularity or regional adenopathy.
IMPRESSION: 2.7 cm left mid thyroid TR 4 nodule meets criteria for biopsy as
above.

1.6 cm right superior TR 3 nodule meets criteria for follow-up in 1
year.

The above is in keeping with the ACR TI-RADS recommendations - [HOSPITAL] 6043;[DATE].

## 2022-08-03 DIAGNOSIS — F411 Generalized anxiety disorder: Secondary | ICD-10-CM | POA: Diagnosis not present

## 2022-08-03 DIAGNOSIS — F339 Major depressive disorder, recurrent, unspecified: Secondary | ICD-10-CM | POA: Diagnosis not present

## 2022-08-03 DIAGNOSIS — M545 Low back pain, unspecified: Secondary | ICD-10-CM | POA: Diagnosis not present

## 2022-08-03 DIAGNOSIS — M542 Cervicalgia: Secondary | ICD-10-CM | POA: Diagnosis not present

## 2022-08-17 DIAGNOSIS — F339 Major depressive disorder, recurrent, unspecified: Secondary | ICD-10-CM | POA: Diagnosis not present

## 2022-08-17 DIAGNOSIS — F411 Generalized anxiety disorder: Secondary | ICD-10-CM | POA: Diagnosis not present

## 2022-08-18 DIAGNOSIS — M542 Cervicalgia: Secondary | ICD-10-CM | POA: Diagnosis not present

## 2022-08-18 DIAGNOSIS — M545 Low back pain, unspecified: Secondary | ICD-10-CM | POA: Diagnosis not present

## 2022-08-24 DIAGNOSIS — F411 Generalized anxiety disorder: Secondary | ICD-10-CM | POA: Diagnosis not present

## 2022-08-24 DIAGNOSIS — F339 Major depressive disorder, recurrent, unspecified: Secondary | ICD-10-CM | POA: Diagnosis not present

## 2022-08-31 DIAGNOSIS — F411 Generalized anxiety disorder: Secondary | ICD-10-CM | POA: Diagnosis not present

## 2022-08-31 DIAGNOSIS — F339 Major depressive disorder, recurrent, unspecified: Secondary | ICD-10-CM | POA: Diagnosis not present

## 2022-08-31 DIAGNOSIS — M545 Low back pain, unspecified: Secondary | ICD-10-CM | POA: Diagnosis not present

## 2022-08-31 DIAGNOSIS — M542 Cervicalgia: Secondary | ICD-10-CM | POA: Diagnosis not present

## 2022-09-07 DIAGNOSIS — F411 Generalized anxiety disorder: Secondary | ICD-10-CM | POA: Diagnosis not present

## 2022-09-07 DIAGNOSIS — F339 Major depressive disorder, recurrent, unspecified: Secondary | ICD-10-CM | POA: Diagnosis not present

## 2022-09-21 DIAGNOSIS — F339 Major depressive disorder, recurrent, unspecified: Secondary | ICD-10-CM | POA: Diagnosis not present

## 2022-09-21 DIAGNOSIS — F411 Generalized anxiety disorder: Secondary | ICD-10-CM | POA: Diagnosis not present

## 2022-09-27 DIAGNOSIS — M542 Cervicalgia: Secondary | ICD-10-CM | POA: Diagnosis not present

## 2022-09-27 DIAGNOSIS — M545 Low back pain, unspecified: Secondary | ICD-10-CM | POA: Diagnosis not present

## 2022-10-05 DIAGNOSIS — M545 Low back pain, unspecified: Secondary | ICD-10-CM | POA: Diagnosis not present

## 2022-10-05 DIAGNOSIS — F411 Generalized anxiety disorder: Secondary | ICD-10-CM | POA: Diagnosis not present

## 2022-10-05 DIAGNOSIS — F339 Major depressive disorder, recurrent, unspecified: Secondary | ICD-10-CM | POA: Diagnosis not present

## 2022-10-05 DIAGNOSIS — M542 Cervicalgia: Secondary | ICD-10-CM | POA: Diagnosis not present

## 2022-10-11 DIAGNOSIS — M542 Cervicalgia: Secondary | ICD-10-CM | POA: Diagnosis not present

## 2022-10-11 DIAGNOSIS — M545 Low back pain, unspecified: Secondary | ICD-10-CM | POA: Diagnosis not present

## 2022-10-20 DIAGNOSIS — M542 Cervicalgia: Secondary | ICD-10-CM | POA: Diagnosis not present

## 2022-10-20 DIAGNOSIS — M545 Low back pain, unspecified: Secondary | ICD-10-CM | POA: Diagnosis not present

## 2022-10-25 DIAGNOSIS — M545 Low back pain, unspecified: Secondary | ICD-10-CM | POA: Diagnosis not present

## 2022-10-25 DIAGNOSIS — M542 Cervicalgia: Secondary | ICD-10-CM | POA: Diagnosis not present

## 2022-10-26 DIAGNOSIS — F5101 Primary insomnia: Secondary | ICD-10-CM | POA: Diagnosis not present

## 2022-10-26 DIAGNOSIS — F339 Major depressive disorder, recurrent, unspecified: Secondary | ICD-10-CM | POA: Diagnosis not present

## 2022-10-26 DIAGNOSIS — M797 Fibromyalgia: Secondary | ICD-10-CM | POA: Diagnosis not present

## 2022-10-26 DIAGNOSIS — F411 Generalized anxiety disorder: Secondary | ICD-10-CM | POA: Diagnosis not present

## 2022-11-02 DIAGNOSIS — F339 Major depressive disorder, recurrent, unspecified: Secondary | ICD-10-CM | POA: Diagnosis not present

## 2022-11-02 DIAGNOSIS — F411 Generalized anxiety disorder: Secondary | ICD-10-CM | POA: Diagnosis not present

## 2022-11-09 DIAGNOSIS — F339 Major depressive disorder, recurrent, unspecified: Secondary | ICD-10-CM | POA: Diagnosis not present

## 2022-11-09 DIAGNOSIS — F411 Generalized anxiety disorder: Secondary | ICD-10-CM | POA: Diagnosis not present

## 2022-11-10 DIAGNOSIS — M542 Cervicalgia: Secondary | ICD-10-CM | POA: Diagnosis not present

## 2022-11-10 DIAGNOSIS — M545 Low back pain, unspecified: Secondary | ICD-10-CM | POA: Diagnosis not present

## 2022-11-16 DIAGNOSIS — F411 Generalized anxiety disorder: Secondary | ICD-10-CM | POA: Diagnosis not present

## 2022-11-16 DIAGNOSIS — F339 Major depressive disorder, recurrent, unspecified: Secondary | ICD-10-CM | POA: Diagnosis not present

## 2022-11-17 DIAGNOSIS — M545 Low back pain, unspecified: Secondary | ICD-10-CM | POA: Diagnosis not present

## 2022-11-17 DIAGNOSIS — M542 Cervicalgia: Secondary | ICD-10-CM | POA: Diagnosis not present

## 2022-11-23 DIAGNOSIS — M542 Cervicalgia: Secondary | ICD-10-CM | POA: Diagnosis not present

## 2022-11-23 DIAGNOSIS — F339 Major depressive disorder, recurrent, unspecified: Secondary | ICD-10-CM | POA: Diagnosis not present

## 2022-11-23 DIAGNOSIS — M545 Low back pain, unspecified: Secondary | ICD-10-CM | POA: Diagnosis not present

## 2022-11-23 DIAGNOSIS — F411 Generalized anxiety disorder: Secondary | ICD-10-CM | POA: Diagnosis not present

## 2022-11-30 DIAGNOSIS — F339 Major depressive disorder, recurrent, unspecified: Secondary | ICD-10-CM | POA: Diagnosis not present

## 2022-11-30 DIAGNOSIS — F411 Generalized anxiety disorder: Secondary | ICD-10-CM | POA: Diagnosis not present

## 2022-12-05 DIAGNOSIS — F339 Major depressive disorder, recurrent, unspecified: Secondary | ICD-10-CM | POA: Diagnosis not present

## 2022-12-05 DIAGNOSIS — F411 Generalized anxiety disorder: Secondary | ICD-10-CM | POA: Diagnosis not present

## 2022-12-07 DIAGNOSIS — M542 Cervicalgia: Secondary | ICD-10-CM | POA: Diagnosis not present

## 2022-12-07 DIAGNOSIS — M545 Low back pain, unspecified: Secondary | ICD-10-CM | POA: Diagnosis not present

## 2022-12-14 DIAGNOSIS — F411 Generalized anxiety disorder: Secondary | ICD-10-CM | POA: Diagnosis not present

## 2022-12-14 DIAGNOSIS — F339 Major depressive disorder, recurrent, unspecified: Secondary | ICD-10-CM | POA: Diagnosis not present

## 2022-12-18 DIAGNOSIS — M542 Cervicalgia: Secondary | ICD-10-CM | POA: Diagnosis not present

## 2022-12-18 DIAGNOSIS — M545 Low back pain, unspecified: Secondary | ICD-10-CM | POA: Diagnosis not present

## 2022-12-21 DIAGNOSIS — F339 Major depressive disorder, recurrent, unspecified: Secondary | ICD-10-CM | POA: Diagnosis not present

## 2022-12-21 DIAGNOSIS — F411 Generalized anxiety disorder: Secondary | ICD-10-CM | POA: Diagnosis not present

## 2022-12-27 DIAGNOSIS — M542 Cervicalgia: Secondary | ICD-10-CM | POA: Diagnosis not present

## 2022-12-27 DIAGNOSIS — M545 Low back pain, unspecified: Secondary | ICD-10-CM | POA: Diagnosis not present

## 2022-12-28 DIAGNOSIS — F339 Major depressive disorder, recurrent, unspecified: Secondary | ICD-10-CM | POA: Diagnosis not present

## 2022-12-28 DIAGNOSIS — F411 Generalized anxiety disorder: Secondary | ICD-10-CM | POA: Diagnosis not present

## 2023-01-05 DIAGNOSIS — M542 Cervicalgia: Secondary | ICD-10-CM | POA: Diagnosis not present

## 2023-01-05 DIAGNOSIS — M545 Low back pain, unspecified: Secondary | ICD-10-CM | POA: Diagnosis not present

## 2023-01-11 DIAGNOSIS — F411 Generalized anxiety disorder: Secondary | ICD-10-CM | POA: Diagnosis not present

## 2023-01-11 DIAGNOSIS — F339 Major depressive disorder, recurrent, unspecified: Secondary | ICD-10-CM | POA: Diagnosis not present

## 2023-05-10 ENCOUNTER — Ambulatory Visit (INDEPENDENT_AMBULATORY_CARE_PROVIDER_SITE_OTHER): Payer: Medicaid Other

## 2023-05-10 ENCOUNTER — Encounter: Payer: Self-pay | Admitting: Podiatry

## 2023-05-10 ENCOUNTER — Ambulatory Visit (INDEPENDENT_AMBULATORY_CARE_PROVIDER_SITE_OTHER): Payer: Medicaid Other | Admitting: Podiatry

## 2023-05-10 DIAGNOSIS — G5761 Lesion of plantar nerve, right lower limb: Secondary | ICD-10-CM | POA: Diagnosis not present

## 2023-05-10 DIAGNOSIS — G629 Polyneuropathy, unspecified: Secondary | ICD-10-CM | POA: Diagnosis not present

## 2023-05-10 DIAGNOSIS — M722 Plantar fascial fibromatosis: Secondary | ICD-10-CM

## 2023-05-10 MED ORDER — GABAPENTIN 300 MG PO CAPS: 300.0000 mg | ORAL_CAPSULE | Freq: Three times a day (TID) | ORAL | 3 refills | Status: DC

## 2023-05-10 MED ORDER — TRIAMCINOLONE ACETONIDE 10 MG/ML IJ SUSP
10.0000 mg | Freq: Once | INTRAMUSCULAR | Status: AC
  Administered 2023-05-10: 10 mg via INTRA_ARTICULAR

## 2023-05-10 NOTE — Progress Notes (Signed)
Subjective:   Patient ID: Brandy Stone, female   DOB: 58 y.o.   MRN: [id number]   HPI Patient is getting burning and tingling of both feet and has pain in the mid arch area right that has gotten worse recently with history of shoe gear and orthotic attempts.  Has not been seen by [place] in approximately 3 years.  Patient smokes periodically tries to be active but does have fibromyalgia   Review of Systems  All other systems reviewed and are negative.       Objective:  Physical Exam Vitals and nursing note reviewed.  Constitutional:      Appearance: She is well-developed.  Pulmonary:     Effort: Pulmonary effort is normal.  Musculoskeletal:        General: Normal range of motion.  Skin:    General: Skin is warm.  Neurological:     Mental Status: She is alert.     Vascular status intact bilateral with patient found to have mild loss of neurological sensation bilateral and is noted to have inflammation pain mid arch area right history gabapentin that she is run out of that seems to be helpful for her foot pain and her fibromyalgia.  Good digital perfusion well-oriented      Assessment:  Acute plantar fasciitis right along with nervelike sensations with probability of fibromyalgia and chronic foot pain     Plan:  H&P x-rays reviewed discussed Planter fasciitis right over left sterile prep injected the mid arch area right 3 mg Dexasone Kenalog 5 mg Xylocaine applied sterile dressing then discussed chronic systemic condition and discussed neuropathy and placed patient on gabapentin 300 mg 3 times daily.  Reappoint as symptoms indicate continue shoe gear which I was pleased with when she showed me the different shoes she has  X-rays indicate that there is moderate cavus of the arch I did not note calcification within the plantar fascia bilateral

## 2023-05-18 ENCOUNTER — Encounter: Payer: Self-pay | Admitting: Nurse Practitioner

## 2023-05-18 ENCOUNTER — Telehealth: Payer: Self-pay | Admitting: Nurse Practitioner

## 2023-05-18 ENCOUNTER — Ambulatory Visit (INDEPENDENT_AMBULATORY_CARE_PROVIDER_SITE_OTHER): Payer: Medicaid Other | Admitting: Nurse Practitioner

## 2023-05-18 VITALS — BP 128/84 | HR 100 | Temp 98.2°F | Ht 67.0 in | Wt 210.2 lb

## 2023-05-18 DIAGNOSIS — D259 Leiomyoma of uterus, unspecified: Secondary | ICD-10-CM

## 2023-05-18 DIAGNOSIS — M797 Fibromyalgia: Secondary | ICD-10-CM

## 2023-05-18 DIAGNOSIS — F419 Anxiety disorder, unspecified: Secondary | ICD-10-CM | POA: Diagnosis not present

## 2023-05-18 DIAGNOSIS — E66811 Obesity, class 1: Secondary | ICD-10-CM | POA: Insufficient documentation

## 2023-05-18 DIAGNOSIS — Z6832 Body mass index (BMI) 32.0-32.9, adult: Secondary | ICD-10-CM | POA: Diagnosis not present

## 2023-05-18 NOTE — Telephone Encounter (Signed)
 Please request all records for all dates from Dr. Avon Boers (prev PCP) and Dr. Harless Lien (OBGYN)

## 2023-05-18 NOTE — Assessment & Plan Note (Signed)
 Labs ordered, further recommendations may be made based upon these results

## 2023-05-18 NOTE — Assessment & Plan Note (Signed)
 Patient Brandy Stone to follow-up with OB/GYN for evaluation of fibroids and uterus and to discuss treatment options.  She was also told that the vaginal bleeding can also be signs of other issues such as cancer, so she should follow-up with OB/GYN soon as possible.  She reports understanding.

## 2023-05-18 NOTE — Progress Notes (Signed)
 New Patient Office Visit  Subjective    Patient ID: Brandy Stone, female    DOB: [birth year]  Age: 58 y.o. MRN: [id number]  CC:  Chief Complaint  Patient presents with   New Patient (Initial Visit)    Anxiety, pain every day   Menopause, brain fog  Huge Fibroid Need new Therapist  Skin issue close to the nose      HPI Brandy Stone presents to establish care Her main concern is known uterine fibroid and wanting to pursue hysterectomy. Reports recent postmenopausal bleeding that has subsided. Sees Dr. Taavon as OBGYN reports she has upcoming appointment.  She also states that she has mammogram and Pap smears through that office, reports history of precancerous changes of cervix in the past. She is also requesting referral to counseling services related to her anxiety and fibromyalgia.  She does see psychiatry, but would like to start weekly counseling services.  Outpatient Encounter Medications as of 05/18/2023  Medication Sig   acyclovir (ZOVIRAX) 200 MG capsule acyclovir 200 mg capsule  TAKE 1 CAPSULE BY MOUTH FIVE TIMES DAILY   albuterol  (PROVENTIL  HFA;VENTOLIN  HFA) 108 (90 BASE) MCG/ACT inhaler Inhale 2 puffs into the lungs every 6 (six) hours as needed. For shortness of breath   Cholecalciferol (VITAMIN D3) 2000 units TABS Take 1 tablet by mouth daily.   COMBIPATCH 0.05-0.25 MG/DAY    cyclobenzaprine  (FLEXERIL ) 10 MG tablet cyclobenzaprine  10 mg tablet   gabapentin  (NEURONTIN ) 300 MG capsule gabapentin  300 mg capsule  TAKE 1 CAPSULE BY MOUTH FOUR TIMES DAILY   LORazepam  (ATIVAN ) 0.5 MG tablet Take 0.5 mg by mouth as needed.    Multiple Vitamin (MULTIVITAMIN) tablet Take 1 tablet by mouth daily.   [DISCONTINUED] DULoxetine (CYMBALTA) 30 MG capsule    [DISCONTINUED] gabapentin  (NEURONTIN ) 300 MG capsule Take 1 capsule (300 mg total) by mouth 3 (three) times daily.   No facility-administered encounter medications on file as of 05/18/2023.    Past Medical History:   Diagnosis Date   Anemia    Asthma    Fibromyalgia    Herpes simplex virus (HSV) infection    HPV (human papilloma virus) infection    Hx of cervical cancer    Infertility, female    PTSD (post-traumatic stress disorder)    Trauma    Vaginal Pap smear, abnormal     Past Surgical History:  Procedure Laterality Date   CERVIX SURGERY  1989   not sure if it was a LEEP   cryotheraphy     MANDIBLE RECONSTRUCTION  2000   RHINOPLASTY  1991   Skin dysplasia     several lesions removed - precancerous   tooth     crown placement x3    Family History  Problem Relation Age of Onset   Endometriosis Mother    Hypertension Mother    Hyperlipidemia Mother    Colon polyps Mother    Hyperlipidemia Father    Lung cancer Father    Melanoma Father    Colon polyps Father    Colon polyps Brother    Esophageal cancer Paternal Grandfather    Colon cancer Neg Hx    Pancreatic cancer Neg Hx    Stomach cancer Neg Hx    Liver disease Neg Hx    Rectal cancer Neg Hx     Social History   Socioeconomic History   Marital status: Divorced    Spouse name: Not on file   Number of children: Not on file   Years  of education: Not on file   Highest education level: Not on file  Occupational History   Not on file  Tobacco Use   Smoking status: Some Days    Types: Cigarettes   Smokeless tobacco: Never  Vaping Use   Vaping status: Never Used  Substance and Sexual Activity   Alcohol use: Yes    Comment: social   Drug use: No   Sexual activity: Yes    Birth control/protection: None  Other Topics Concern   Not on file  Social History Narrative   Not on file   Social Drivers of Health   Financial Resource Strain: Not on file  Food Insecurity: Not on file  Transportation Needs: Not on file  Physical Activity: Not on file  Stress: Not on file  Social Connections: Not on file  Intimate Partner Violence: Not on file    ROS: see HPI      Objective    BP 128/84   Pulse 100    Temp 98.2 F (36.8 C) (Temporal)   Ht 5' 7 (1.702 m)   Wt 210 lb 4 oz (95.4 kg)   SpO2 95%   BMI 32.93 kg/m   Physical Exam Vitals reviewed.  Constitutional:      General: She is not in acute distress.    Appearance: Normal appearance.  HENT:     Head: Normocephalic and atraumatic.  Neck:     Vascular: No carotid bruit.  Cardiovascular:     Rate and Rhythm: Normal rate and regular rhythm.     Pulses: Normal pulses.     Heart sounds: Normal heart sounds.  Pulmonary:     Effort: Pulmonary effort is normal.     Breath sounds: Normal breath sounds.  Skin:    General: Skin is warm and dry.  Neurological:     General: No focal deficit present.     Mental Status: She is alert and oriented to person, place, and time.  Psychiatric:        Mood and Affect: Mood normal.        Behavior: Behavior normal.        Judgment: Judgment normal.         Assessment & Plan:   Problem List Items Addressed This Visit       Genitourinary   Fibroid uterus   Patient Chad to follow-up with OB/GYN for evaluation of fibroids and uterus and to discuss treatment options.  She was also told that the vaginal bleeding can also be signs of other issues such as cancer, so she should follow-up with OB/GYN soon as possible.  She reports understanding.        Other   Anxiety   Chronic, stable Follow-up with psychiatry as scheduled Will refer to counseling services per patient request.      Relevant Orders   Ambulatory referral to Psychology   Fibromyalgia - Primary   Chronic, stable Continue treatment with Flexeril  10 mg as needed and gabapentin  300 mg 4 times daily.      Relevant Orders   Ambulatory referral to Psychology   Class 1 obesity with body mass index (BMI) of 32.0 to 32.9 in adult   Labs ordered, further recommendations may be made based upon these results       Relevant Orders   CBC   Comprehensive metabolic panel   Hemoglobin A1c   Lipid panel   TSH   Ambulatory  referral to Psychology   I will asked staff to  reach out to PCP and OB/GYN to receive previous medical records.  Return in about 1 month (around 06/15/2023) for CPE with Brandy - allow 40 minutes.   Brandy FORBES Pereyra, NP

## 2023-05-18 NOTE — Assessment & Plan Note (Signed)
 Chronic, stable Follow-up with psychiatry as scheduled Will refer to counseling services per patient request.

## 2023-05-18 NOTE — Assessment & Plan Note (Signed)
 Chronic, stable Continue treatment with Flexeril  10 mg as needed and gabapentin  300 mg 4 times daily.

## 2023-05-28 ENCOUNTER — Ambulatory Visit: Payer: Self-pay | Admitting: Nurse Practitioner

## 2023-05-28 NOTE — Telephone Encounter (Addendum)
Copied from CRM [id number]. Topic: Clinical - Red Word Triage >> May 28, 2023  7:48 AM [place] A wrote: [facility] that prompted transfer to Nurse Triage: Temperature of 93, Blood Pressure 162/92, Headache from Sinus, Patient feet are hurting and swollen   Chief Complaint: Body aches, feet pain Symptoms: body aches Frequency: intermittent Pertinent Negatives: Patient denies fever Disposition: [] ED /[] Urgent Care (no appt availability in office) / [x] Appointment(In office/virtual)/ []  [facility]/ [] Home Care/ [] Refused Recommended Disposition /[] [facility]/ []  Follow-up with PCP Additional Notes: Patient report body aches and feet pain x 3-4 days. Unsure of specific cause, denies fever or viral symptoms. Pt reports history of fibromyalgi. Pt sts that her BP is going down after taking some Midol  and Pedialyte now 140/90. Pt wanting to be seen. RN offered next avail appt with PCP on Thursday 2/20, pt became upset and stated "I could be dead by then".  RN offered appt today with another provider in the office, pt then states that she is getting frustrated and will call back later. Pt also sts that she wanted to be seen virtually, RN advising that she cannot be properly assessed through video with her symptoms. Pt again stated that she will call back  02/17 3:28-Called back to get to scheduled. Appt dated for 2/18, pt also needing labs done, scheduled as well  Reason for Disposition  [1] MODERATE pain (e.g., interferes with normal activities) AND [2] present > 3 days  Answer Assessment - Initial Assessment Questions 1. ONSET: "When did the muscle aches or body pains start?"      Started 3 days ago  2. LOCATION: "What part of your body is hurting?" (e.g., entire body, arms, legs)      Moves around, feet are the worse. Feels like she has cramps in her glutes  3. SEVERITY: "How bad is the pain?" (Scale 1-10; or mild, moderate, severe)   - MILD (1-3): doesn't interfere with  normal activities    - MODERATE (4-7): interferes with normal activities or awakens from sleep    - SEVERE (8-10):  excruciating pain, unable to do any normal activities      Moderate pain, able to drive. But constantly in pain. Worse in the morning  4. CAUSE: "What do you think is causing the pains?"     Unsure; but has history of fibromyalgia  5. FEVER: "Have you been having fever?"     No fever  6. OTHER SYMPTOMS: "Do you have any other symptoms?" (e.g., chest pain, weakness, rash, cold or flu symptoms, weight loss)     Ears are painful  7. PREGNANCY: "Is there any chance you are pregnant?" "When was your last menstrual period?"     No  8. TRAVEL: "Have you traveled out of the country in the last month?" (e.g., travel history, exposures)     No  Protocols used: Muscle Aches and Body Pain-A-AH

## 2023-05-29 ENCOUNTER — Ambulatory Visit: Payer: Medicaid Other | Admitting: Internal Medicine

## 2023-05-29 ENCOUNTER — Other Ambulatory Visit: Payer: Medicaid Other

## 2023-06-01 DIAGNOSIS — R52 Pain, unspecified: Secondary | ICD-10-CM | POA: Diagnosis not present

## 2023-06-12 ENCOUNTER — Telehealth: Payer: Self-pay

## 2023-06-12 DIAGNOSIS — R635 Abnormal weight gain: Secondary | ICD-10-CM | POA: Diagnosis not present

## 2023-06-12 DIAGNOSIS — D259 Leiomyoma of uterus, unspecified: Secondary | ICD-10-CM | POA: Diagnosis not present

## 2023-06-12 DIAGNOSIS — R140 Abdominal distension (gaseous): Secondary | ICD-10-CM | POA: Diagnosis not present

## 2023-06-12 DIAGNOSIS — R030 Elevated blood-pressure reading, without diagnosis of hypertension: Secondary | ICD-10-CM | POA: Diagnosis not present

## 2023-06-12 DIAGNOSIS — K5900 Constipation, unspecified: Secondary | ICD-10-CM | POA: Diagnosis not present

## 2023-06-12 DIAGNOSIS — Z760 Encounter for issue of repeat prescription: Secondary | ICD-10-CM | POA: Diagnosis not present

## 2023-06-12 NOTE — Telephone Encounter (Signed)
 Copied from CRM [id number]. Topic: Clinical - Medication Question >> Jun 12, 2023  4:19 PM Chantha C wrote: Reason for CRM: Patient [phone] states insurance changed and needs medication refill for since her psychiatrist does not take her insurance. Can NP, Jiles Prows start prescribe the medications: gabapentin (NEURONTIN) 300 MG capsule has a few more pills left and LORazepam (ATIVAN) 0.5 MG tablet out of the medication. Please advise and call back.   [id number] [place], [place] - [address] AT [facility] [address] [address] Phone:[phone] >> Jun 12, 2023  4:29 PM Chantha C wrote: Patient has a behavorial health appointment with Dr. Merry Proud 07/02/23. Patient has a business trip out of state from 06/21/23 to 06/30/23 and wants to make sure she has medications.

## 2023-06-12 NOTE — Telephone Encounter (Signed)
 Copied from CRM [id number]. Topic: Clinical - Medication Refill >> Jun 12, 2023  3:56 PM Tiffany H wrote: Most Recent Primary Care Visit:  Provider: Elenore Paddy  Department: [facility] [place]  Visit Type: NEW PT - OFFICE VISIT  Date: 05/18/2023  Medication: LORazepam (ATIVAN) 0.5 MG tablet [[id number]  Has the patient contacted their pharmacy? No (Agent: If no, request that the patient contact the pharmacy for the refill. If patient does not wish to contact the pharmacy document the reason why and proceed with request.) (Agent: If yes, when and what did the pharmacy advise?)  Is this the correct pharmacy for this prescription? No If no, delete pharmacy and type the correct one.  This is the patient's preferred pharmacy:   [id number] - [place], [place] - [address] AT [facility] OF [address] [place] [address] Phone: [phone] Fax: [phone]   Has the prescription been filled recently? No  Is the patient out of the medication? Yes  Has the patient been seen for an appointment in the last year OR does the patient have an upcoming appointment? Yes  Can we respond through MyChart? No  Agent: Please be advised that Rx refills may take up to 3 business days. We ask that you follow-up with your pharmacy.

## 2023-06-13 ENCOUNTER — Other Ambulatory Visit: Payer: Self-pay | Admitting: Nurse Practitioner

## 2023-06-13 DIAGNOSIS — Z0000 Encounter for general adult medical examination without abnormal findings: Secondary | ICD-10-CM

## 2023-06-13 MED ORDER — LORAZEPAM 0.5 MG PO TABS: 0.5000 mg | ORAL_TABLET | Freq: Every day | ORAL | 0 refills | Status: AC | PRN

## 2023-06-14 ENCOUNTER — Telehealth: Payer: Self-pay

## 2023-06-14 ENCOUNTER — Other Ambulatory Visit: Payer: Self-pay | Admitting: Nurse Practitioner

## 2023-06-14 MED ORDER — ALBUTEROL SULFATE HFA 108 (90 BASE) MCG/ACT IN AERS: 2.0000 | INHALATION_SPRAY | Freq: Four times a day (QID) | RESPIRATORY_TRACT | 3 refills | Status: AC | PRN

## 2023-06-14 NOTE — Telephone Encounter (Signed)
 Copied from CRM [id number]. Topic: Clinical - Medication Refill >> Jun 14, 2023  9:28 AM Theodis Sato wrote: Most Recent Primary Care Visit:  Provider: Elenore Paddy  Department: [facility] [place]  Visit Type: NEW PT - OFFICE VISIT  Date: 05/18/2023  Medication: albuterol (PROVENTIL HFA;VENTOLIN HFA) 108 (90 BASE) MCG/ACT inhaler   Has the patient contacted their pharmacy? No, out of refills (Agent: If no, request that the patient contact the pharmacy for the refill. If patient does not wish to contact the pharmacy document the reason why and proceed with request.) (Agent: If yes, when and what did the pharmacy advise?)  Is this the correct pharmacy for this prescription? Yes If no, delete pharmacy and type the correct one.  This is the patient's preferred pharmacy:  [facility] #[id number] - [place], [place] - [address] AT [facility] OF [address] [place] [address] Phone: [phone] Fax: [phone]   Has the prescription been filled recently? No  Is the patient out of the medication? Yes  Has the patient been seen for an appointment in the last year OR does the patient have an upcoming appointment? Yes  Can we respond through MyChart? No  Agent: Please be advised that Rx refills may take up to 3 business days. We ask that you follow-up with your pharmacy.

## 2023-06-14 NOTE — Telephone Encounter (Signed)
 This will be 1st fill w you.. ok to refill?

## 2023-06-14 NOTE — Telephone Encounter (Signed)
 Copied from CRM [id number]. Topic: Clinical - Prescription Issue >> Jun 14, 2023  1:48 PM Sim Boast F wrote: Reason for CRM: Pateint LORazepam (ATIVAN) 0.5 MG tablet medication was sent to pharmacy yesterday and she needs to take this medication 4 times a day vs 1 time a day. Please call patient.

## 2023-06-20 ENCOUNTER — Other Ambulatory Visit

## 2023-06-20 ENCOUNTER — Encounter: Payer: BC Managed Care – PPO | Admitting: Nurse Practitioner

## 2023-06-20 DIAGNOSIS — R140 Abdominal distension (gaseous): Secondary | ICD-10-CM | POA: Diagnosis not present

## 2023-06-20 DIAGNOSIS — D251 Intramural leiomyoma of uterus: Secondary | ICD-10-CM | POA: Diagnosis not present

## 2023-06-20 DIAGNOSIS — D259 Leiomyoma of uterus, unspecified: Secondary | ICD-10-CM | POA: Diagnosis not present

## 2023-06-20 DIAGNOSIS — K5900 Constipation, unspecified: Secondary | ICD-10-CM | POA: Diagnosis not present

## 2023-06-20 DIAGNOSIS — D252 Subserosal leiomyoma of uterus: Secondary | ICD-10-CM | POA: Diagnosis not present

## 2023-06-21 ENCOUNTER — Other Ambulatory Visit

## 2023-06-28 ENCOUNTER — Telehealth: Payer: Self-pay | Admitting: Gastroenterology

## 2023-06-28 NOTE — Telephone Encounter (Signed)
 Patient called and stated that she had gone to her OB for abdominal pain and they order a Ultrasound of her abdomen and her results came back as severally constipated. Patient is wanting to either schedule an appointment with Dr. Lavon Paganini or see if Dr. Lavon Paganini well give her a sooner colonoscopy. I advise patient she was not due until July of this year. Patient was admit that she gets a sooner colonoscopy. Patient is requesting a call back. Please advise.

## 2023-06-29 NOTE — Telephone Encounter (Signed)
 OB/GYN request send via my chart electronic fax Mailed previous PCP request form to pt.

## 2023-06-29 NOTE — Telephone Encounter (Signed)
 Called the patient to assist with making an appointment. No answer. Voicemail is full.

## 2023-07-02 ENCOUNTER — Ambulatory Visit (INDEPENDENT_AMBULATORY_CARE_PROVIDER_SITE_OTHER): Payer: Medicaid Other | Admitting: Behavioral Health

## 2023-07-02 DIAGNOSIS — F32A Depression, unspecified: Secondary | ICD-10-CM | POA: Diagnosis not present

## 2023-07-02 DIAGNOSIS — F419 Anxiety disorder, unspecified: Secondary | ICD-10-CM

## 2023-07-02 NOTE — Progress Notes (Signed)
   Brandy Lever, LMFT

## 2023-07-03 NOTE — Telephone Encounter (Signed)
 Pt stated that she recently had an US done by her GYN Dr which showed severe constipation. Pt was recommended to try Miralax twice a day until a large BM and then once a day, drink lots of water and get an adequate amount of exercise. Pt requested an office visit to discuss scheduling a sooner colonoscopy.  Pt was scheduled to see Doug Sou PA on 09/05/2023 at 8:30 AM. Pt made aware.  Pt verbalized understanding with all questions answered.

## 2023-07-06 ENCOUNTER — Encounter: Payer: Self-pay | Admitting: Nurse Practitioner

## 2023-07-06 ENCOUNTER — Ambulatory Visit: Admitting: Nurse Practitioner

## 2023-07-06 VITALS — BP 126/80 | HR 83 | Temp 98.3°F | Ht 67.0 in | Wt 213.0 lb

## 2023-07-06 DIAGNOSIS — M797 Fibromyalgia: Secondary | ICD-10-CM

## 2023-07-06 DIAGNOSIS — F419 Anxiety disorder, unspecified: Secondary | ICD-10-CM

## 2023-07-06 DIAGNOSIS — K589 Irritable bowel syndrome without diarrhea: Secondary | ICD-10-CM

## 2023-07-06 DIAGNOSIS — E66811 Obesity, class 1: Secondary | ICD-10-CM | POA: Diagnosis not present

## 2023-07-06 DIAGNOSIS — Z6832 Body mass index (BMI) 32.0-32.9, adult: Secondary | ICD-10-CM

## 2023-07-06 LAB — URINALYSIS, ROUTINE W REFLEX MICROSCOPIC
Bilirubin Urine: NEGATIVE
Hgb urine dipstick: NEGATIVE
Ketones, ur: NEGATIVE
Leukocytes,Ua: NEGATIVE
Nitrite: NEGATIVE
Specific Gravity, Urine: 1.025 (ref 1.000–1.030)
Total Protein, Urine: NEGATIVE
Urine Glucose: NEGATIVE
Urobilinogen, UA: 0.2 (ref 0.0–1.0)
pH: 5.5 (ref 5.0–8.0)

## 2023-07-06 LAB — CBC
HCT: 44.1 % (ref 36.0–46.0)
Hemoglobin: 14.8 g/dL (ref 12.0–15.0)
MCHC: 33.5 g/dL (ref 30.0–36.0)
MCV: 91.7 fl (ref 78.0–100.0)
Platelets: 256 10*3/uL (ref 150.0–400.0)
RBC: 4.81 Mil/uL (ref 3.87–5.11)
RDW: 13.5 % (ref 11.5–15.5)
WBC: 10.1 10*3/uL (ref 4.0–10.5)

## 2023-07-06 LAB — COMPREHENSIVE METABOLIC PANEL WITH GFR
ALT: 28 U/L (ref 0–35)
AST: 22 U/L (ref 0–37)
Albumin: 4.4 g/dL (ref 3.5–5.2)
Alkaline Phosphatase: 62 U/L (ref 39–117)
BUN: 17 mg/dL (ref 6–23)
CO2: 27 meq/L (ref 19–32)
Calcium: 9.3 mg/dL (ref 8.4–10.5)
Chloride: 101 meq/L (ref 96–112)
Creatinine, Ser: 1 mg/dL (ref 0.40–1.20)
GFR: 62.81 mL/min (ref >60.00)
Glucose, Bld: 134 mg/dL — ABNORMAL HIGH (ref 70–99)
Potassium: 4.2 meq/L (ref 3.5–5.1)
Sodium: 135 meq/L (ref 135–145)
Total Bilirubin: 0.5 mg/dL (ref 0.2–1.2)
Total Protein: 7.3 g/dL (ref 6.0–8.3)

## 2023-07-06 LAB — LIPID PANEL
Cholesterol: 254 mg/dL — ABNORMAL HIGH (ref 0–200)
HDL: 43.7 mg/dL (ref >39.00)
LDL Cholesterol: 155 mg/dL — ABNORMAL HIGH (ref 0–99)
NonHDL: 210.14
Total CHOL/HDL Ratio: 6
Triglycerides: 276 mg/dL — ABNORMAL HIGH (ref 0.0–149.0)
VLDL: 55.2 mg/dL — ABNORMAL HIGH (ref 0.0–40.0)

## 2023-07-06 LAB — TSH: TSH: 0.99 u[IU]/mL (ref 0.35–5.50)

## 2023-07-06 NOTE — Progress Notes (Signed)
 Established Patient Office Visit  Subjective   Patient ID: Brandy Stone, female    DOB: Apr 07, 1966  Age: 58 y.o. MRN: [id number]  Chief Complaint  Patient presents with   Constipation   Constipation: Patient is IBS-C.  Reports being diagnosed with this many years ago.  Recently has had worsening constipation.  Has been trying over-the-counter Colace and MiraLAX without success and having a bowel movement.  Not having any abdominal pain, nausea, vomiting currently.  Does have gastroenterologist and is planning to undergo colonoscopy this summer.  She is scheduled to see them in about 2 months to discuss her constipation further.  Anxiety: Is starting counseling services.  Would like to stay on Ativan 0.5 mg daily as needed.  Fibromyalgia: Reports that since starting gabapentin she has experienced unintentional weight gain.  Currently prescribed gabapentin 300 mg 4 times daily and Flexeril 10 mg as needed.Marland Kitchen  She would like to taper off of this if possible.    ROS: see HPI    Objective:     BP 126/80 (BP Location: Left Arm, Patient Position: Sitting, Cuff Size: Normal)   Pulse 83   Temp 98.3 F (36.8 C) (Oral)   Ht 5\' 7"  (1.702 m)   Wt 213 lb (96.6 kg)   SpO2 97%   BMI 33.36 kg/m    Physical Exam Vitals reviewed.  Constitutional:      General: She is not in acute distress.    Appearance: Normal appearance.  HENT:     Head: Normocephalic and atraumatic.  Neck:     Vascular: No carotid bruit.  Cardiovascular:     Rate and Rhythm: Normal rate and regular rhythm.     Pulses: Normal pulses.     Heart sounds: Normal heart sounds.  Pulmonary:     Effort: Pulmonary effort is normal.     Breath sounds: Normal breath sounds.  Abdominal:     General: Abdomen is flat. Bowel sounds are decreased. There is no distension.     Palpations: Abdomen is soft.     Tenderness: There is no abdominal tenderness.  Skin:    General: Skin is warm and dry.  Neurological:     General:  No focal deficit present.     Mental Status: She is alert and oriented to person, place, and time.  Psychiatric:        Mood and Affect: Mood normal.        Behavior: Behavior normal.        Judgment: Judgment normal.      No results found for any visits on 07/06/23.    The ASCVD Risk score (Arnett DK, et al., 2019) failed to calculate for the following reasons:   Cannot find a previous HDL lab   Cannot find a previous total cholesterol lab    Assessment & Plan:   Problem List Items Addressed This Visit       Digestive   Irritable bowel syndrome - Primary   Chronic, constipation slightly worsened recently Recommend use of over-the-counter Fleet enema.  Then continue Colace twice a day and/or MiraLAX as needed. Follow-up with GI as scheduled.      Relevant Orders   TSH   Lipid panel   Hemoglobin A1c   CBC   Urinalysis, Routine w reflex microscopic   Urine Culture     Other   Anxiety   Chronic, stable Continue counseling and lorazepam 0.5mg  daily as needed for anxiety      Relevant Orders  TSH   Lipid panel   Hemoglobin A1c   CBC   Urinalysis, Routine w reflex microscopic   Urine Culture   Fibromyalgia   Chronic I am agreeable to attempting to taper off of gabapentin. Will collect metabolic panel, based on results will send taper instructions.      Relevant Orders   Comprehensive metabolic panel with GFR   TSH   Lipid panel   Hemoglobin A1c   CBC   Urinalysis, Routine w reflex microscopic   Urine Culture   Class 1 obesity with body mass index (BMI) of 32.0 to 32.9 in adult   Chronic Labs ordered, further recommendations may be made based upon his results       Relevant Orders   TSH   Lipid panel   Hemoglobin A1c   CBC   Urinalysis, Routine w reflex microscopic   Urine Culture    Return in about 6 months (around 01/06/2024) for CPE with Maralyn Sago - allow 40 minutes.    Elenore Paddy, NP

## 2023-07-06 NOTE — Assessment & Plan Note (Signed)
 Chronic, constipation slightly worsened recently Recommend use of over-the-counter Fleet enema.  Then continue Colace twice a day and/or MiraLAX as needed. Follow-up with GI as scheduled.

## 2023-07-06 NOTE — Assessment & Plan Note (Signed)
 Chronic I am agreeable to attempting to taper off of gabapentin. Will collect metabolic panel, based on results will send taper instructions.

## 2023-07-06 NOTE — Assessment & Plan Note (Signed)
 Chronic Labs ordered, further recommendations may be made based upon his results.

## 2023-07-06 NOTE — Assessment & Plan Note (Signed)
 Chronic, stable Continue counseling and lorazepam 0.5mg  daily as needed for anxiety

## 2023-07-06 NOTE — Patient Instructions (Addendum)
 Try an over the counter enema to help with your constipation. Once you pass stool continue taking colace twice a day until you can see GI.  When able to start exercising with a goal of per week. Start slow and try 30 minute session of exercise twice a week and slowly work up to 5 days per week.   Following an anti-inflammatory type diet such as mediterranean diet is also recommended.

## 2023-07-09 LAB — URINE CULTURE: Result:: NO GROWTH

## 2023-07-09 LAB — HEMOGLOBIN A1C

## 2023-07-11 ENCOUNTER — Encounter: Payer: Self-pay | Admitting: Nurse Practitioner

## 2023-07-11 DIAGNOSIS — R87810 Cervical high risk human papillomavirus (HPV) DNA test positive: Secondary | ICD-10-CM | POA: Diagnosis not present

## 2023-07-11 DIAGNOSIS — Z1151 Encounter for screening for human papillomavirus (HPV): Secondary | ICD-10-CM | POA: Diagnosis not present

## 2023-07-11 DIAGNOSIS — F329 Major depressive disorder, single episode, unspecified: Secondary | ICD-10-CM | POA: Diagnosis not present

## 2023-07-11 DIAGNOSIS — Z1231 Encounter for screening mammogram for malignant neoplasm of breast: Secondary | ICD-10-CM | POA: Diagnosis not present

## 2023-07-11 DIAGNOSIS — Z6832 Body mass index (BMI) 32.0-32.9, adult: Secondary | ICD-10-CM | POA: Diagnosis not present

## 2023-07-11 DIAGNOSIS — D259 Leiomyoma of uterus, unspecified: Secondary | ICD-10-CM | POA: Diagnosis not present

## 2023-07-11 DIAGNOSIS — K5900 Constipation, unspecified: Secondary | ICD-10-CM | POA: Diagnosis not present

## 2023-07-11 DIAGNOSIS — Z124 Encounter for screening for malignant neoplasm of cervix: Secondary | ICD-10-CM | POA: Diagnosis not present

## 2023-07-11 DIAGNOSIS — Z0000 Encounter for general adult medical examination without abnormal findings: Secondary | ICD-10-CM | POA: Diagnosis not present

## 2023-07-12 ENCOUNTER — Other Ambulatory Visit: Payer: Self-pay | Admitting: Nurse Practitioner

## 2023-07-12 DIAGNOSIS — M797 Fibromyalgia: Secondary | ICD-10-CM

## 2023-07-12 MED ORDER — GABAPENTIN 100 MG PO CAPS: ORAL_CAPSULE | ORAL | 0 refills | Status: DC

## 2023-07-12 NOTE — Progress Notes (Signed)
 Estimated Creatinine Clearance: 75 mL/min (by C-G formula based on SCr of 1 mg/dL).

## 2023-07-19 ENCOUNTER — Encounter: Payer: Self-pay | Admitting: Dermatology

## 2023-07-19 ENCOUNTER — Ambulatory Visit (INDEPENDENT_AMBULATORY_CARE_PROVIDER_SITE_OTHER): Admitting: Dermatology

## 2023-07-19 ENCOUNTER — Ambulatory Visit: Admitting: Behavioral Health

## 2023-07-19 VITALS — BP 130/78 | HR 71

## 2023-07-19 DIAGNOSIS — L821 Other seborrheic keratosis: Secondary | ICD-10-CM

## 2023-07-19 DIAGNOSIS — Z808 Family history of malignant neoplasm of other organs or systems: Secondary | ICD-10-CM

## 2023-07-19 DIAGNOSIS — F419 Anxiety disorder, unspecified: Secondary | ICD-10-CM

## 2023-07-19 DIAGNOSIS — D485 Neoplasm of uncertain behavior of skin: Secondary | ICD-10-CM

## 2023-07-19 DIAGNOSIS — D492 Neoplasm of unspecified behavior of bone, soft tissue, and skin: Secondary | ICD-10-CM | POA: Diagnosis not present

## 2023-07-19 DIAGNOSIS — D2339 Other benign neoplasm of skin of other parts of face: Secondary | ICD-10-CM | POA: Diagnosis not present

## 2023-07-19 DIAGNOSIS — F32A Depression, unspecified: Secondary | ICD-10-CM

## 2023-07-19 NOTE — Progress Notes (Signed)
   Deneise Lever, LMFT
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
## 2023-07-19 NOTE — Patient Instructions (Signed)

## 2023-07-19 NOTE — Progress Notes (Signed)
   New Patient Visit   Subjective  Brandy Stone is a 58 y.o. female who presents for the following: Spot of concern. No hx of skin caner. Family hx of MM. Patient appears anxious. Lesions have not been previously treated.  The patient has spots, moles and lesions to be evaluated, some may be new or changing.  The following portions of the chart were reviewed this encounter and updated as appropriate: medications, allergies, medical history  Review of Systems:  No other skin or systemic complaints except as noted in HPI or Assessment and Plan.  Objective  Well appearing patient in no apparent distress; mood and affect are within normal limits.    A focused examination was performed of the following areas: Face  Relevant exam findings are noted in the Assessment and Plan.  Left Nasal Sidewall 4 mm fluid filled papule   Assessment & Plan   SEBORRHEIC KERATOSIS- right infraorbital region - Stuck-on, waxy, tan-brown papules and/or plaques  - Benign-appearing - Discussed benign etiology and prognosis. - Observe - Call for any changes  NEOPLASM OF UNCERTAIN BEHAVIOR OF SKIN Left Nasal Sidewall Skin / nail biopsy Type of biopsy: tangential   Informed consent: discussed and consent obtained   Timeout: patient name, date of birth, surgical site, and procedure verified   Procedure prep:  Patient was prepped and draped in usual sterile fashion Prep type:  Isopropyl alcohol Anesthesia: the lesion was anesthetized in a standard fashion   Anesthetic:  1% lidocaine w/ epinephrine 1-100,000 buffered w/ 8.4% NaHCO3 Instrument used: DermaBlade   Hemostasis achieved with: aluminum chloride   Outcome: patient tolerated procedure well   Post-procedure details: sterile dressing applied and wound care instructions given   Dressing type: petrolatum and bandage   Specimen 1 - Surgical pathology Differential Diagnosis: r/o NMSC vs hidrocystoma vs other   Check Margins: No SEBORRHEIC  KERATOSES    Return for fbsc with brenda.  I, Manual Meier, Surg Tech III, am acting as scribe for Gwenith Daily, MD.   Documentation: I have reviewed the above documentation for accuracy and completeness, and I agree with the above.  Gwenith Daily, MD

## 2023-07-23 LAB — SURGICAL PATHOLOGY

## 2023-08-02 ENCOUNTER — Ambulatory Visit: Admitting: Behavioral Health

## 2023-08-02 DIAGNOSIS — F32A Depression, unspecified: Secondary | ICD-10-CM | POA: Diagnosis not present

## 2023-08-02 DIAGNOSIS — F419 Anxiety disorder, unspecified: Secondary | ICD-10-CM | POA: Diagnosis not present

## 2023-08-02 NOTE — Progress Notes (Signed)
   Brandy Lever, LMFT

## 2023-08-02 NOTE — Progress Notes (Signed)
 [facility] Counselor/Therapist Progress Note  Patient ID: Brandy Stone, MRN: [id number],    Date: 08/02/2023  Time Spent: 45 min Caregility video; Pt is home inside in private & Provider is working remotely from [employer]. Pt is aware of the risks/limitations of telehealth & consents to Tx today.  Time In: 1:00pm Time Out: 1:45pm   Treatment Type: Individual Therapy  Reported Symptoms: Trauma Sx; elevated anx/dep  Mental Status Exam: Appearance:  Casual     Behavior: Appropriate and Sharing  Motor: Normal  Speech/Language:  Clear and Coherent  Affect: Appropriate  Mood: anxious  Thought process: normal  Thought content:   WNL  Sensory/Perceptual disturbances:   WNL  Orientation: oriented to person, place, time/date, and situation  Attention: Good  Concentration: Good  Memory: WNL  Fund of knowledge:  Good  Insight:   Good  Judgment:  Good  Impulse Control: Good   Risk Assessment: Danger to Self:  No Self-injurious Behavior: No Danger to Others: No Duty to Warn:no Physical Aggression / Violence:No  Access to Firearms a concern: No  Gang Involvement:No   Subjective: Pt is describing the life of 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 She is aware of the details of ea dog & how dangerous the female pit bulls can be for her to keep.   Her recent past surgery for removal of a cyst under her L eye has turned out to be non-cancerous. She is relieved. Her skin is fair & she is glad it did not turn out negatively.   Interventions: Insight-Oriented  Diagnosis:Anxiety  Depressive disorder  Plan: Insiya describes her Hx as a young person today living in [place] in her 84's as a [employer]. She is sensitive to the plight of her Parents since the 1990's. She is tuned into her schedule w/her Mother & StepFr. She is w/them daily. She is trying to accept the fate of her Mother & StepFr. She is adapting for the sake of Family.  Target Date: 08/09/2023  Progress:  6  Frequency: Once every 2-3 wks  Modality: Deborrah Fam, LMFT

## 2023-08-23 ENCOUNTER — Ambulatory Visit: Admitting: Behavioral Health

## 2023-08-23 DIAGNOSIS — F419 Anxiety disorder, unspecified: Secondary | ICD-10-CM

## 2023-08-23 DIAGNOSIS — F32A Depression, unspecified: Secondary | ICD-10-CM | POA: Diagnosis not present

## 2023-08-23 NOTE — Progress Notes (Signed)
 [facility] Counselor/Therapist Progress Note  Patient ID: Brandy Stone, MRN: [id number],    Date: 08/23/2023  Time Spent: 45 min Caregility video; Pt is home in private & Provider working remotely from [employer]. Pt is aware of the risks/limitations of telehealth & consents to Tx today. Time In: 3:00pm Time Out: 3:45pm  Treatment Type: Individual Therapy  Reported Symptoms: Elevated anx/dep & stress due to recent trip to [place], [place]. One of her friends got really sick & felt bad the whole trip.   Mental Status Exam: Appearance:  Casual     Behavior: Appropriate and Sharing  Motor: Normal  Speech/Language:  Clear and Coherent  Affect: Appropriate  Mood: normal  Thought process: normal  Thought content:   WNL  Sensory/Perceptual disturbances:   WNL  Orientation: oriented to person, place, time/date, and situation  Attention: Good  Concentration: Good  Memory: WNL  Fund of knowledge:  Good  Insight:   Good  Judgment:  Good  Impulse Control: Good   Risk Assessment: Danger to Self:  No Self-injurious Behavior: No Danger to Others: No Duty to Warn:no Physical Aggression / Violence:No  Access to Firearms a concern: No  Gang Involvement:No   Subjective: Pt's recent trip to [place] went well. She really enjoyed the time away. One of her good friends got very sick on the trip. They made the best of it.    Pt is trying to secure her Real ID @ the DMV. This has to be planned & will take time.   Pt has an upcoming trip to [place] for a Scuba Diving Conf. 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   Interventions: Cognitive Behavioral Therapy and Insight-Oriented  Diagnosis:Anxiety  Depressive disorder  Plan: Dajanai will record her thoughts/feelings & anxieties so she can focus our sessions.   Target Date: 09/09/2023  Progress: 6  Frequency: Once every 2-3 wks  Modality: Deborrah Fam,  LMFT

## 2023-08-23 NOTE — Progress Notes (Signed)
   Brandy Lever, LMFT

## 2023-09-05 ENCOUNTER — Ambulatory Visit: Admitting: Gastroenterology

## 2023-09-14 ENCOUNTER — Ambulatory Visit: Admitting: Behavioral Health

## 2023-09-14 NOTE — Progress Notes (Deleted)
   Brandy Lever, LMFT

## 2023-09-25 ENCOUNTER — Ambulatory Visit: Admitting: Physician Assistant

## 2023-09-25 DIAGNOSIS — Z91199 Patient's noncompliance with other medical treatment and regimen due to unspecified reason: Secondary | ICD-10-CM

## 2023-09-25 NOTE — Progress Notes (Signed)
 Patient no-showed today's appointment.
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
## 2023-12-27 ENCOUNTER — Encounter: Payer: Self-pay | Admitting: Nurse Practitioner

## 2023-12-28 ENCOUNTER — Other Ambulatory Visit: Payer: Self-pay | Admitting: Nurse Practitioner

## 2023-12-28 DIAGNOSIS — E66811 Obesity, class 1: Secondary | ICD-10-CM

## 2023-12-28 DIAGNOSIS — E785 Hyperlipidemia, unspecified: Secondary | ICD-10-CM

## 2024-01-02 ENCOUNTER — Other Ambulatory Visit (INDEPENDENT_AMBULATORY_CARE_PROVIDER_SITE_OTHER)

## 2024-01-02 DIAGNOSIS — Z6832 Body mass index (BMI) 32.0-32.9, adult: Secondary | ICD-10-CM | POA: Diagnosis not present

## 2024-01-02 DIAGNOSIS — E66811 Obesity, class 1: Secondary | ICD-10-CM | POA: Diagnosis not present

## 2024-01-02 DIAGNOSIS — E785 Hyperlipidemia, unspecified: Secondary | ICD-10-CM

## 2024-01-02 DIAGNOSIS — E6609 Other obesity due to excess calories: Secondary | ICD-10-CM | POA: Diagnosis not present

## 2024-01-02 LAB — COMPREHENSIVE METABOLIC PANEL WITH GFR
ALT: 14 U/L (ref 0–35)
AST: 14 U/L (ref 0–37)
Albumin: 4.2 g/dL (ref 3.5–5.2)
Alkaline Phosphatase: 55 U/L (ref 39–117)
BUN: 10 mg/dL (ref 6–23)
CO2: 26 meq/L (ref 19–32)
Calcium: 8.9 mg/dL (ref 8.4–10.5)
Chloride: 103 meq/L (ref 96–112)
Creatinine, Ser: 0.94 mg/dL (ref 0.40–1.20)
GFR: 67.42 mL/min (ref >60.00)
Glucose, Bld: 116 mg/dL — ABNORMAL HIGH (ref 70–99)
Potassium: 4.1 meq/L (ref 3.5–5.1)
Sodium: 137 meq/L (ref 135–145)
Total Bilirubin: 0.5 mg/dL (ref 0.2–1.2)
Total Protein: 6.8 g/dL (ref 6.0–8.3)

## 2024-01-02 LAB — LIPID PANEL
Cholesterol: 264 mg/dL — ABNORMAL HIGH (ref 0–200)
HDL: 41.7 mg/dL (ref >39.00)
LDL Cholesterol: 198 mg/dL — ABNORMAL HIGH (ref 0–99)
NonHDL: 222.65
Total CHOL/HDL Ratio: 6
Triglycerides: 122 mg/dL (ref 0.0–149.0)
VLDL: 24.4 mg/dL (ref 0.0–40.0)

## 2024-01-03 ENCOUNTER — Ambulatory Visit: Payer: Self-pay | Admitting: Nurse Practitioner

## 2024-01-03 ENCOUNTER — Ambulatory Visit: Admitting: Nurse Practitioner

## 2024-01-03 VITALS — BP 136/82 | HR 74 | Temp 98.2°F | Ht 67.0 in | Wt 190.0 lb

## 2024-01-03 DIAGNOSIS — L659 Nonscarring hair loss, unspecified: Secondary | ICD-10-CM

## 2024-01-03 DIAGNOSIS — F419 Anxiety disorder, unspecified: Secondary | ICD-10-CM

## 2024-01-03 DIAGNOSIS — Z23 Encounter for immunization: Secondary | ICD-10-CM

## 2024-01-03 DIAGNOSIS — Z1211 Encounter for screening for malignant neoplasm of colon: Secondary | ICD-10-CM | POA: Diagnosis not present

## 2024-01-03 DIAGNOSIS — E785 Hyperlipidemia, unspecified: Secondary | ICD-10-CM

## 2024-01-03 DIAGNOSIS — R6889 Other general symptoms and signs: Secondary | ICD-10-CM | POA: Diagnosis not present

## 2024-01-03 DIAGNOSIS — Z6832 Body mass index (BMI) 32.0-32.9, adult: Secondary | ICD-10-CM | POA: Diagnosis not present

## 2024-01-03 DIAGNOSIS — M797 Fibromyalgia: Secondary | ICD-10-CM | POA: Diagnosis not present

## 2024-01-03 DIAGNOSIS — E66811 Obesity, class 1: Secondary | ICD-10-CM

## 2024-01-03 LAB — VITAMIN B12: Vitamin B-12: 1121 pg/mL — ABNORMAL HIGH (ref 211–911)

## 2024-01-03 LAB — TSH: TSH: 1.54 u[IU]/mL (ref 0.35–5.50)

## 2024-01-03 LAB — FERRITIN: Ferritin: 78.7 ng/mL (ref 10.0–291.0)

## 2024-01-03 NOTE — Assessment & Plan Note (Signed)
 Fibromyalgia with associated fatigue and pain Experiences flares affecting activity. Discussed acupuncture and benefits of dry needling. - Consider acupuncture for pain management. - Continue current lifestyle modifications -Discontinued gabapentin  due to weight gain, continue off of gabapentin 

## 2024-01-03 NOTE — Assessment & Plan Note (Signed)
  Alopecia (nonscarring hair loss) Noticing hair thinning. Discussed potential causes including thyroid  dysfunction and nutritional deficiencies. - Order labs, consider referral to dermatology if no etiology identified

## 2024-01-03 NOTE — Assessment & Plan Note (Signed)
 Cognitive impairment and memory difficulties Reports cognitive and memory issues. Potential factors include anxiety, possible ADD, and vitamin deficiencies. - Refer to neurology for evaluation. - Refer to [place] attention specialist for potential ADD. - Check vitamin B12 and thyroid  levels during next lab work.

## 2024-01-03 NOTE — Assessment & Plan Note (Signed)
 Hypercholesterolemia with elevated LDL cholesterol LDL cholesterol elevated at 198 mg/dL. Discussed concerns about statin side effects and alternative treatments. She prefers non-medication options and further risk assessment. - Refer to lipid specialist Dr. Mona. - Continue dietary and lifestyle modifications.

## 2024-01-03 NOTE — Addendum Note (Signed)
 Addended by: LEAR, RONNALD SQUIBB on: 01/03/2024 04:35 PM   Modules accepted: Orders

## 2024-01-03 NOTE — Assessment & Plan Note (Signed)
  General Health Maintenance Due for colon cancer screening. Received flu shot today. - Patient advised to schedule and complete colon cancer screening with her GI

## 2024-01-03 NOTE — Assessment & Plan Note (Addendum)
 Insomnia and disrupted sleep-wake cycle Significant sleep disturbances potentially exacerbated by anxiety and fibromyalgia. - Implement sleep hygiene measures. Anxiety disorder Contributing to sleep disturbances. Currently using Ativan  as needed. Hesitant to start daily antidepressants. - Continue Ativan  as needed.

## 2024-01-03 NOTE — Progress Notes (Signed)
 Established Patient Office Visit  Subjective   Patient ID: Brandy Stone, female    DOB: 1965/08/04  Age: 58 y.o. MRN: [id number]  Chief Complaint  Patient presents with   Hyperlipidemia   Discussed the use of AI scribe software for clinical note transcription with the patient, who gave verbal consent to proceed.  History of Present Illness Brandy Stone is a 58 year old female with fibromyalgia and hyperlipidemia who presents for follow-up on recent lab results and weight management.  Weight loss and dietary modification - Weight decreased from 213 lbs to 190 lbs after discontinuing gabapentin . Not on weight loss medication.  - Weight loss regimen includes increased physical activity and dietary changes, such as rice bowls and salads  Dyslipidemia - Triglyceride levels improved with recent lifestyle changes and fish oil supplement (276 to 122) - LDL cholesterol remains elevated at 198 mg/dL (previously 844) - Family history of high cholesterol and heart disease  Sleep disturbance and daytime fatigue - Insomnia and irregular sleep patterns - Daytime fatigue associated with her fibromyalgia resulting in naps  Anxiety symptoms - Anxiety with ruminating thoughts, especially about her dog's health - Uses Ativan  0.5 mg as needed for anxiety - Prefers to remain medication-free aside from supplements (B12, fish oil, turmeric, vitamin D3)  Cognitive impairment - Difficulty concentrating and memory problems - Suspects possible ADD but has not been formally evaluated -Concerned about other potential etiologies  Hair thinning - Notices hair thinning - Concerned about potential underlying causes - No recent thyroid  function evaluation      ROS: see HPI    Objective:     BP 136/82   Pulse 74   Temp 98.2 F (36.8 C) (Temporal)   Ht 5' 7 (1.702 m)   Wt 190 lb (86.2 kg)   BMI 29.76 kg/m    Physical Exam Vitals reviewed.  Constitutional:      General: She is not  in acute distress.    Appearance: Normal appearance.  HENT:     Head: Normocephalic and atraumatic.  Neck:     Vascular: No carotid bruit.  Cardiovascular:     Rate and Rhythm: Normal rate and regular rhythm.     Pulses: Normal pulses.     Heart sounds: Normal heart sounds.  Pulmonary:     Effort: Pulmonary effort is normal.     Breath sounds: Normal breath sounds.  Skin:    General: Skin is warm and dry.  Neurological:     General: No focal deficit present.     Mental Status: She is alert and oriented to person, place, and time.  Psychiatric:        Mood and Affect: Mood normal.        Behavior: Behavior normal.        Judgment: Judgment normal.      No results found for any visits on 01/03/24.    The 10-year ASCVD risk score (Arnett DK, et al., 2019) is: 4.4%    Assessment & Plan:   Problem List Items Addressed This Visit       Other   Anxiety   Insomnia and disrupted sleep-wake cycle Significant sleep disturbances potentially exacerbated by anxiety and fibromyalgia. - Implement sleep hygiene measures. Anxiety disorder Contributing to sleep disturbances. Currently using Ativan  as needed. Hesitant to start daily antidepressants. - Continue Ativan  as needed.      Fibromyalgia - Primary   Fibromyalgia with associated fatigue and pain Experiences flares affecting activity. Discussed acupuncture and benefits  of dry needling. - Consider acupuncture for pain management. - Continue current lifestyle modifications -Discontinued gabapentin  due to weight gain, continue off of gabapentin       Class 1 obesity with body mass index (BMI) of 32.0 to 32.9 in adult   Relevant Orders   Amb ref to Medical Nutrition Therapy-MNT   HLD (hyperlipidemia)   Hypercholesterolemia with elevated LDL cholesterol LDL cholesterol elevated at 198 mg/dL. Discussed concerns about statin side effects and alternative treatments. She prefers non-medication options and further risk  assessment. - Refer to lipid specialist Dr. Mona. - Continue dietary and lifestyle modifications.      Relevant Orders   Ambulatory referral to Cardiology   Amb ref to Medical Nutrition Therapy-MNT   Forgetfulness   Cognitive impairment and memory difficulties Reports cognitive and memory issues. Potential factors include anxiety, possible ADD, and vitamin deficiencies. - Refer to neurology for evaluation. - Refer to [place] attention specialist for potential ADD. - Check vitamin B12 and thyroid  levels during next lab work.      Relevant Orders   Ambulatory referral to Neurology   Ambulatory referral to Psychiatry   Hair loss    Alopecia (nonscarring hair loss) Noticing hair thinning. Discussed potential causes including thyroid  dysfunction and nutritional deficiencies. - Order labs, consider referral to dermatology if no etiology identified      Relevant Orders   Vitamin B12   TSH   Ferritin   Colon cancer screening    General Health Maintenance Due for colon cancer screening. Received flu shot today. - Patient advised to schedule and complete colon cancer screening with her GI      Assessment and Plan Assessment & Plan Hypercholesterolemia with elevated LDL cholesterol LDL cholesterol elevated at 198 mg/dL. Discussed concerns about statin side effects and alternative treatments. She prefers non-medication options and further risk assessment. - Refer to lipid specialist Dr. Mona. - Continue dietary and lifestyle modifications.  Fibromyalgia with associated fatigue and pain Experiences flares affecting activity. Discussed acupuncture and benefits of dry needling. - Consider acupuncture for pain management. - Continue current lifestyle modifications -Discontinued gabapentin  due to weight gain, continue off of gabapentin   Insomnia and disrupted sleep-wake cycle Significant sleep disturbances potentially exacerbated by anxiety and fibromyalgia. - Implement sleep  hygiene measures.  Anxiety disorder Contributing to sleep disturbances. Currently using Ativan  as needed. Hesitant to start daily antidepressants. - Continue Ativan  as needed.  Cognitive impairment and memory difficulties Reports cognitive and memory issues. Potential factors include anxiety, possible ADD, and vitamin deficiencies. - Refer to neurology for evaluation. - Refer to [place] attention specialist for potential ADD. - Check vitamin B12 and thyroid  levels during next lab work.  Alopecia (nonscarring hair loss) Noticing hair thinning. Discussed potential causes including thyroid  dysfunction and nutritional deficiencies. - Order labs, consider referral to dermatology if no etiology identified  General Health Maintenance Due for colon cancer screening. Received flu shot today. - Patient advised to schedule and complete colon cancer screening with her GI    Return in about 3 months (around 04/03/2024) for CPE with Anglin Baillie  - 40 mins. I personally spent a total of 45 minutes in the care of the patient today including preparing to see the patient, getting/reviewing separately obtained history, performing a medically appropriate exam/evaluation, counseling and educating, placing orders, referring and communicating with other health care professionals, and documenting clinical information in the EHR.    Lauraine FORBES Pereyra, NP

## 2024-02-12 ENCOUNTER — Encounter: Attending: Nurse Practitioner | Admitting: Dietician

## 2024-02-12 ENCOUNTER — Encounter: Payer: Self-pay | Admitting: Dietician

## 2024-02-12 DIAGNOSIS — E66811 Obesity, class 1: Secondary | ICD-10-CM | POA: Insufficient documentation

## 2024-02-12 DIAGNOSIS — Z6832 Body mass index (BMI) 32.0-32.9, adult: Secondary | ICD-10-CM | POA: Insufficient documentation

## 2024-02-12 NOTE — Patient Instructions (Addendum)
 Goals Established by Pt  Aim to include more beans/lentils, oats, flax seeds, chia seeds  Get a Bluelinx and aim to go at least 3 days per week (swimming, yoga, walking on track, etc).  LDL Lowering Nutrition Therapy Soluble fiber and impact on lowering cholesterol: Sources: Oats, barley, beans, lentils, fruits (apples, oranges), vegetables (carrots, broccoli), and flaxseeds. Benefits: Soluble fiber reduces the absorption of cholesterol into your bloodstream. Choose Healthy Unsaturated Fats and Omega 3's. Sources: Olive oil, canola oil, avocados, nuts, and Fatty fish (salmon, trout), walnuts, flaxseeds, and chia seeds. Benefits: Helps raise HDL cholesterol and lower LDL cholesterol. Omega-3s help reduce triglycerides and raise HDL cholesterol. Limit Saturated Fats: Sources: Red meat, full-fat dairy products, butter, and coconut oil. Benefits: Reducing intake helps lower LDL cholesterol levels. Eat Plenty of Fruits and Vegetables. Benefits: High in fiber and antioxidants, which help lower LDL cholesterol. Eat Whole Grains. Sources: Brown rice, whole wheat bread, quinoa, and whole grain pasta. Benefits: Whole grains contain more fiber and nutrients than refined grains. Regular Physical Activity: Aim for at least 30 minutes of moderate-intensity exercise most days of the week.

## 2024-02-12 NOTE — Progress Notes (Unsigned)
 Medical Nutrition Therapy  Appointment Start time:  1615  Appointment End time:  1715  Primary concerns today: cholesterol   Referral diagnosis: E66.811 Preferred learning style: no preference indicated Learning readiness: ready   NUTRITION ASSESSMENT   Anthropometrics   Wt 02/12/24: declined  Clinical Medical Hx: reviewed; HLD, allergies, anemia, anxiety, asthma, fibromyalgia Medications: reviewed Labs: 01/02/24: chol 24, LDL 198 Notable Signs/Symptoms: pain/fatigue in fibromyalgia flare Food Allergies: pecans, walnuts, spicy foods (reports gets red in the face)  Lifestyle & Dietary Hx  Pt present today with her mom Elveria.  Up to 5 dogs, owns 2  Had fibromyalhia flare Gained 30 lbn on gabapentin  Has lost 25 lb since, April when stopping ghabaeptin Walking regularly August Would like to do swimming and yoga Eats out 50/50  Estimated daily fluid intake: 64 oz Supplements: omega 3, MVI, vitamin d, turmeric Sleep: 6-8 hours, a lot of broken sleep Stress / self-care: moderate stress Current average weekly physical activity: walking 3x/wk for 20-45 minutes  24-Hr Dietary Recall First Meal: instant oatmeal pack with flax and berries Snack: fruit Second Meal: sandwich Snack: carrots and hummus Third Meal: chopped salad from [employer] OR chicken, brown rice, veggies Snack: none Beverages: water   NUTRITION DIAGNOSIS  NB-1.1 Food and nutrition-related knowledge deficit As related to lack of prior education by a registered dietitian.  As evidenced by pt report.   NUTRITION INTERVENTION  Nutrition education (E-1) on the following topics:   LDL Lowering Nutrition Therapy Soluble fiber and impact on lowering cholesterol: Sources: Oats, barley, beans, lentils, fruits (apples, oranges), vegetables (carrots, broccoli), and flaxseeds. Benefits: Soluble fiber reduces the absorption of cholesterol into your bloodstream. Choose Healthy Unsaturated Fats and Omega 3's.  Sources: Olive oil, canola oil, avocados, nuts, and Fatty fish (salmon, trout), walnuts, flaxseeds, and chia seeds. Benefits: Helps raise HDL cholesterol and lower LDL cholesterol. Omega-3s help reduce triglycerides and raise HDL cholesterol. Limit Saturated Fats: Sources: Red meat, full-fat dairy products, butter, and coconut oil. Benefits: Reducing intake helps lower LDL cholesterol levels. Eat Plenty of Fruits and Vegetables. Benefits: High in fiber and antioxidants, which help lower LDL cholesterol. Eat Whole Grains. Sources: Brown rice, whole wheat bread, quinoa, and whole grain pasta. Benefits: Whole grains contain more fiber and nutrients than refined grains. Regular Physical Activity: Aim for at least 30 minutes of moderate-intensity exercise most days of the week.   Handouts Provided Include  Plate Method  Learning Style & Readiness for Change Teaching method utilized: Visual & Auditory  Demonstrated degree of understanding via: Teach Back  Barriers to learning/adherence to lifestyle change: none  Goals Established by Pt  Aim to include more beans/lentils, oats, flax seeds, chia seeds  Get a [employer]inx and aim to go at least 3 days per week (swimming, yoga, walking on track, etc).   MONITORING & EVALUATION Dietary intake, weekly physical activity, and follow up in ***.  Next Steps  Patient is to call for questions.

## 2024-02-27 ENCOUNTER — Institutional Professional Consult (permissible substitution) (HOSPITAL_BASED_OUTPATIENT_CLINIC_OR_DEPARTMENT_OTHER): Admitting: Internal Medicine

## 2024-03-21 ENCOUNTER — Telehealth: Payer: Self-pay

## 2024-03-21 NOTE — Telephone Encounter (Signed)
 Telephone call placed to patient to schedule a PV and a colonoscopy w/ Dr. Shila (5 yr recall, HOCP).  VM obtained and message left to return call to the scheduling department.

## 2024-03-27 ENCOUNTER — Ambulatory Visit: Admitting: Nurse Practitioner

## 2024-04-11 ENCOUNTER — Encounter (HOSPITAL_BASED_OUTPATIENT_CLINIC_OR_DEPARTMENT_OTHER): Payer: Self-pay

## 2024-04-17 ENCOUNTER — Encounter: Admitting: Dietician

## 2024-05-01 ENCOUNTER — Encounter: Admitting: Dietician

## 2024-05-13 ENCOUNTER — Encounter: Admitting: Dietician

## 2024-05-15 ENCOUNTER — Ambulatory Visit: Admitting: Nurse Practitioner

## 2024-05-28 ENCOUNTER — Encounter: Admitting: Dietician

## 2024-07-03 ENCOUNTER — Ambulatory Visit: Admitting: Nurse Practitioner
